# Patient Record
Sex: Female | Born: 1940
Health system: Southern US, Community
[De-identification: ages and names within clinical notes are randomized; demographics above are authoritative.]

## PROBLEM LIST (undated history)

## (undated) DIAGNOSIS — Z923 Personal history of irradiation: Secondary | ICD-10-CM

## (undated) DIAGNOSIS — Z9221 Personal history of antineoplastic chemotherapy: Secondary | ICD-10-CM

## (undated) DIAGNOSIS — I10 Essential (primary) hypertension: Secondary | ICD-10-CM

## (undated) DIAGNOSIS — C50919 Malignant neoplasm of unspecified site of unspecified female breast: Secondary | ICD-10-CM

## (undated) DIAGNOSIS — E785 Hyperlipidemia, unspecified: Secondary | ICD-10-CM

## (undated) HISTORY — DX: Hyperlipidemia, unspecified: E78.5

## (undated) HISTORY — PX: APPENDECTOMY: SHX54

## (undated) HISTORY — DX: Malignant neoplasm of unspecified site of unspecified female breast: C50.919

## (undated) HISTORY — DX: Essential (primary) hypertension: I10

---

## 1997-06-08 DIAGNOSIS — C50919 Malignant neoplasm of unspecified site of unspecified female breast: Secondary | ICD-10-CM

## 1997-06-08 DIAGNOSIS — Z923 Personal history of irradiation: Secondary | ICD-10-CM

## 1997-06-08 DIAGNOSIS — Z9221 Personal history of antineoplastic chemotherapy: Secondary | ICD-10-CM

## 1997-06-08 HISTORY — PX: MASTECTOMY: SHX3

## 1997-06-08 HISTORY — DX: Personal history of antineoplastic chemotherapy: Z92.21

## 1997-06-08 HISTORY — DX: Personal history of irradiation: Z92.3

## 1997-06-08 HISTORY — DX: Malignant neoplasm of unspecified site of unspecified female breast: C50.919

## 2004-03-08 ENCOUNTER — Ambulatory Visit: Payer: Self-pay | Admitting: Oncology

## 2004-05-15 ENCOUNTER — Ambulatory Visit: Payer: Self-pay | Admitting: Oncology

## 2004-06-08 ENCOUNTER — Ambulatory Visit: Payer: Self-pay | Admitting: Oncology

## 2004-09-03 ENCOUNTER — Ambulatory Visit: Payer: Self-pay | Admitting: Oncology

## 2004-09-06 ENCOUNTER — Ambulatory Visit: Payer: Self-pay | Admitting: Oncology

## 2004-10-06 ENCOUNTER — Ambulatory Visit: Payer: Self-pay | Admitting: Oncology

## 2004-12-10 ENCOUNTER — Ambulatory Visit: Payer: Self-pay | Admitting: Oncology

## 2005-01-06 ENCOUNTER — Ambulatory Visit: Payer: Self-pay | Admitting: Oncology

## 2005-04-08 ENCOUNTER — Ambulatory Visit: Payer: Self-pay | Admitting: Oncology

## 2005-05-08 ENCOUNTER — Ambulatory Visit: Payer: Self-pay | Admitting: Oncology

## 2005-08-12 ENCOUNTER — Ambulatory Visit: Payer: Self-pay | Admitting: Oncology

## 2005-09-06 ENCOUNTER — Ambulatory Visit: Payer: Self-pay | Admitting: Oncology

## 2005-12-10 ENCOUNTER — Ambulatory Visit: Payer: Self-pay | Admitting: Oncology

## 2006-01-06 ENCOUNTER — Ambulatory Visit: Payer: Self-pay | Admitting: Oncology

## 2006-02-06 ENCOUNTER — Ambulatory Visit: Payer: Self-pay | Admitting: Oncology

## 2006-04-13 ENCOUNTER — Ambulatory Visit: Payer: Self-pay | Admitting: Oncology

## 2006-05-08 ENCOUNTER — Ambulatory Visit: Payer: Self-pay | Admitting: Oncology

## 2006-08-24 ENCOUNTER — Ambulatory Visit: Payer: Self-pay | Admitting: Oncology

## 2006-08-25 ENCOUNTER — Ambulatory Visit: Payer: Self-pay | Admitting: Oncology

## 2006-09-07 ENCOUNTER — Ambulatory Visit: Payer: Self-pay | Admitting: Oncology

## 2007-02-07 ENCOUNTER — Ambulatory Visit: Payer: Self-pay | Admitting: Oncology

## 2007-03-02 ENCOUNTER — Ambulatory Visit: Payer: Self-pay | Admitting: Oncology

## 2007-03-09 ENCOUNTER — Ambulatory Visit: Payer: Self-pay | Admitting: Oncology

## 2007-05-11 ENCOUNTER — Ambulatory Visit: Payer: Self-pay | Admitting: Oncology

## 2007-08-07 ENCOUNTER — Ambulatory Visit: Payer: Self-pay | Admitting: Oncology

## 2007-08-31 ENCOUNTER — Ambulatory Visit: Payer: Self-pay | Admitting: Oncology

## 2007-09-07 ENCOUNTER — Ambulatory Visit: Payer: Self-pay | Admitting: Oncology

## 2008-02-07 ENCOUNTER — Ambulatory Visit: Payer: Self-pay | Admitting: Oncology

## 2008-02-22 ENCOUNTER — Ambulatory Visit: Payer: Self-pay | Admitting: Oncology

## 2008-03-08 ENCOUNTER — Ambulatory Visit: Payer: Self-pay | Admitting: Oncology

## 2008-05-16 ENCOUNTER — Ambulatory Visit: Payer: Self-pay | Admitting: Oncology

## 2008-08-06 ENCOUNTER — Ambulatory Visit: Payer: Self-pay | Admitting: Oncology

## 2008-08-22 ENCOUNTER — Ambulatory Visit: Payer: Self-pay | Admitting: Oncology

## 2008-09-06 ENCOUNTER — Ambulatory Visit: Payer: Self-pay | Admitting: Oncology

## 2009-02-06 ENCOUNTER — Ambulatory Visit: Payer: Self-pay | Admitting: Oncology

## 2009-02-19 ENCOUNTER — Ambulatory Visit: Payer: Self-pay | Admitting: Oncology

## 2009-03-08 ENCOUNTER — Ambulatory Visit: Payer: Self-pay | Admitting: Oncology

## 2009-05-21 ENCOUNTER — Ambulatory Visit: Payer: Self-pay | Admitting: Oncology

## 2009-08-06 ENCOUNTER — Ambulatory Visit: Payer: Self-pay | Admitting: Oncology

## 2009-08-20 ENCOUNTER — Ambulatory Visit: Payer: Self-pay | Admitting: Oncology

## 2009-09-06 ENCOUNTER — Ambulatory Visit: Payer: Self-pay | Admitting: Oncology

## 2010-02-06 ENCOUNTER — Ambulatory Visit: Payer: Self-pay | Admitting: Oncology

## 2010-02-20 ENCOUNTER — Ambulatory Visit: Payer: Self-pay | Admitting: Oncology

## 2010-02-21 LAB — CANCER ANTIGEN 27.29: CA 27.29: 16.7 U/mL (ref 0.0–38.6)

## 2010-03-08 ENCOUNTER — Ambulatory Visit: Payer: Self-pay | Admitting: Oncology

## 2010-05-22 ENCOUNTER — Ambulatory Visit: Payer: Self-pay | Admitting: Oncology

## 2010-08-21 ENCOUNTER — Ambulatory Visit: Payer: Self-pay | Admitting: Oncology

## 2010-08-22 LAB — CANCER ANTIGEN 27.29: CA 27.29: 12 U/mL (ref 0.0–38.6)

## 2010-09-07 ENCOUNTER — Ambulatory Visit: Payer: Self-pay | Admitting: Oncology

## 2011-02-23 ENCOUNTER — Ambulatory Visit: Payer: Self-pay | Admitting: Oncology

## 2011-02-24 LAB — CANCER ANTIGEN 27.29: CA 27.29: 11.8 U/mL (ref 0.0–38.6)

## 2011-03-09 ENCOUNTER — Ambulatory Visit: Payer: Self-pay | Admitting: Oncology

## 2011-05-25 ENCOUNTER — Ambulatory Visit: Payer: Self-pay | Admitting: Oncology

## 2011-08-25 ENCOUNTER — Ambulatory Visit: Payer: Self-pay | Admitting: Oncology

## 2011-08-26 LAB — CANCER ANTIGEN 27.29: CA 27.29: 9.2 U/mL (ref 0.0–38.6)

## 2011-09-07 ENCOUNTER — Ambulatory Visit: Payer: Self-pay | Admitting: Oncology

## 2012-02-25 ENCOUNTER — Ambulatory Visit: Payer: Self-pay | Admitting: Oncology

## 2012-02-26 LAB — CANCER ANTIGEN 27.29: CA 27.29: 17 U/mL (ref 0.0–38.6)

## 2012-03-08 ENCOUNTER — Ambulatory Visit: Payer: Self-pay | Admitting: Oncology

## 2012-04-08 ENCOUNTER — Ambulatory Visit: Payer: Self-pay | Admitting: Oncology

## 2012-05-26 ENCOUNTER — Ambulatory Visit: Payer: Self-pay | Admitting: Oncology

## 2013-03-02 ENCOUNTER — Ambulatory Visit: Payer: Self-pay | Admitting: Oncology

## 2013-03-08 ENCOUNTER — Ambulatory Visit: Payer: Self-pay | Admitting: Oncology

## 2013-05-29 ENCOUNTER — Ambulatory Visit: Payer: Self-pay | Admitting: Oncology

## 2014-03-09 ENCOUNTER — Ambulatory Visit: Payer: Self-pay | Admitting: Oncology

## 2014-03-12 LAB — CANCER ANTIGEN 27.29: CA 27.29: 12.1 U/mL (ref 0.0–38.6)

## 2014-04-08 ENCOUNTER — Ambulatory Visit: Payer: Self-pay | Admitting: Oncology

## 2014-06-08 ENCOUNTER — Ambulatory Visit: Payer: Self-pay | Admitting: Oncology

## 2014-06-14 ENCOUNTER — Ambulatory Visit: Payer: Self-pay | Admitting: Oncology

## 2014-07-09 ENCOUNTER — Ambulatory Visit: Payer: Self-pay | Admitting: Oncology

## 2015-03-12 ENCOUNTER — Other Ambulatory Visit: Payer: Self-pay | Admitting: *Deleted

## 2015-03-12 DIAGNOSIS — C50919 Malignant neoplasm of unspecified site of unspecified female breast: Secondary | ICD-10-CM

## 2015-03-13 ENCOUNTER — Inpatient Hospital Stay (HOSPITAL_BASED_OUTPATIENT_CLINIC_OR_DEPARTMENT_OTHER): Payer: Medicare Other | Admitting: Oncology

## 2015-03-13 ENCOUNTER — Inpatient Hospital Stay: Payer: Medicare Other | Attending: Oncology

## 2015-03-13 ENCOUNTER — Encounter: Payer: Self-pay | Admitting: Oncology

## 2015-03-13 VITALS — BP 137/81 | HR 82 | Temp 97.3°F | Resp 16 | Wt 131.4 lb

## 2015-03-13 DIAGNOSIS — I10 Essential (primary) hypertension: Secondary | ICD-10-CM | POA: Diagnosis not present

## 2015-03-13 DIAGNOSIS — Z170 Estrogen receptor positive status [ER+]: Secondary | ICD-10-CM

## 2015-03-13 DIAGNOSIS — C50912 Malignant neoplasm of unspecified site of left female breast: Secondary | ICD-10-CM | POA: Insufficient documentation

## 2015-03-13 DIAGNOSIS — Z79811 Long term (current) use of aromatase inhibitors: Secondary | ICD-10-CM

## 2015-03-13 DIAGNOSIS — Z9012 Acquired absence of left breast and nipple: Secondary | ICD-10-CM | POA: Diagnosis not present

## 2015-03-13 DIAGNOSIS — Z79899 Other long term (current) drug therapy: Secondary | ICD-10-CM | POA: Insufficient documentation

## 2015-03-13 DIAGNOSIS — M810 Age-related osteoporosis without current pathological fracture: Secondary | ICD-10-CM | POA: Insufficient documentation

## 2015-03-13 DIAGNOSIS — E785 Hyperlipidemia, unspecified: Secondary | ICD-10-CM | POA: Insufficient documentation

## 2015-03-13 DIAGNOSIS — Z87891 Personal history of nicotine dependence: Secondary | ICD-10-CM | POA: Diagnosis not present

## 2015-03-13 DIAGNOSIS — C50919 Malignant neoplasm of unspecified site of unspecified female breast: Secondary | ICD-10-CM

## 2015-03-13 MED ORDER — EXEMESTANE 25 MG PO TABS: 25.0000 mg | ORAL_TABLET | Freq: Every day | ORAL | Status: DC

## 2015-03-14 LAB — CANCER ANTIGEN 27.29: CA 27.29: 9.6 U/mL (ref 0.0–38.6)

## 2015-03-29 DIAGNOSIS — C50912 Malignant neoplasm of unspecified site of left female breast: Secondary | ICD-10-CM | POA: Insufficient documentation

## 2015-03-29 NOTE — Progress Notes (Signed)
[place]  Telephone:(336) [phone] Fax:(336) [phone]  ID: Amber Camacho OB: 04-16-1941  MR#: [id number]  FYB#:[id number]  Patient Care Team: No Pcp Per Patient as PCP - General (General Practice)  CHIEF COMPLAINT:  Stage IV adenocarcinoma the breast, status post neoadjuvant chemotherapy plus mastectomy and XRT in March 1999. Initially treated with tamoxifen and upon evidence of progressive disease in 2004 patient was switched to Arimidex and then to Aromasin.   Chief Complaint  Patient presents with  . Breast Cancer    1 year f/u    INTERVAL HISTORY: Patient returns to clinic for laboratory work and routine yearly evaluation. She continues to feel well and remains asymptomatic.  She is tolerating her Aromasin without any problems or complaints.  She denies any myalgias, arthralgias, or hot flashes.  She has a good appetite and has maintained her weight.  She denies any pain. She denies any chest pain.  She has no nausea, vomiting, constipation, or diarrhea.  Patient offers no specific complaints today.  REVIEW OF SYSTEMS:   Review of Systems  Constitutional: Negative for fever and malaise/fatigue.  Respiratory: Negative.   Cardiovascular: Negative.   Gastrointestinal: Negative.   Musculoskeletal: Negative.  Negative for joint pain.  Neurological: Negative.  Negative for sensory change and weakness.    As per HPI. Otherwise, a complete review of systems is negatve.  PAST MEDICAL HISTORY: Past Medical History  Diagnosis Date  . Hyperlipidemia   . Hypertension   . Breast cancer ([place])     PAST SURGICAL HISTORY: Past Surgical History  Procedure Laterality Date  . Appendectomy    . Mastectomy Left     FAMILY HISTORY: Reviewed and unchanged. No reported history of malignancy or chronic disease.     ADVANCED DIRECTIVES:    HEALTH MAINTENANCE: Social History  Substance Use Topics  . Smoking status: Former Smoker    Quit date: 03/13/1999  .  Smokeless tobacco: Never Used  . Alcohol Use: No     Colonoscopy:  PAP:  Bone density:  Lipid panel:  Allergies  Allergen Reactions  . Strawberry Extract Rash    Current Outpatient Prescriptions  Medication Sig Dispense Refill  . Calcium Carb-Cholecalciferol (CALCIUM 600 + D PO) Take 1 tablet by mouth daily.    . Multiple Vitamin (MULTIVITAMIN) tablet Take 1 tablet by mouth daily.    Marland Kitchen BONIVA 150 MG tablet     . exemestane (AROMASIN) 25 MG tablet Take 1 tablet (25 mg total) by mouth daily after breakfast. 30 tablet 11  . lisinopril-hydrochlorothiazide (PRINZIDE,ZESTORETIC) 10-12.5 MG tablet     . metoprolol succinate (TOPROL-XL) 25 MG 24 hr tablet     . NEXIUM 40 MG capsule     . simvastatin (ZOCOR) 20 MG tablet     . Vitamin D, Ergocalciferol, (DRISDOL) 50000 UNITS CAPS capsule      No current facility-administered medications for this visit.    OBJECTIVE: Filed Vitals:   03/13/15 1143  BP: 137/81  Pulse: 82  Temp: 97.3 F (36.3 C)  Resp: 16     There is no height on file to calculate BMI.    ECOG FS:0 - Asymptomatic  General: Well-developed, well-nourished, no acute distress. Eyes: Pink conjunctiva, anicteric sclera. Breasts: Right breast and axilla without lumps or masses. Left chest wall without evidence of recurrence. Lungs: Clear to auscultation bilaterally. Heart: Regular rate and rhythm. No rubs, murmurs, or gallops. Abdomen: Soft, nontender, nondistended. No organomegaly noted, normoactive bowel sounds. Musculoskeletal: No edema, cyanosis, or  clubbing. Neuro: Alert, answering all questions appropriately. Cranial nerves grossly intact. Skin: No rashes or petechiae noted. Psych: Normal affect.   LAB RESULTS:  No results found for: NA, K, CL, CO2, GLUCOSE, BUN, CREATININE, CALCIUM, PROT, ALBUMIN, AST, ALT, ALKPHOS, BILITOT, GFRNONAA, GFRAA  No results found for: WBC, NEUTROABS, HGB, HCT, MCV, PLT   STUDIES: No results found.  ASSESSMENT: Stage IV  carcinoma of breast  PLAN:    1. Breast cancer: No evidence of disease.  She continues to tolerate her Aromasin well.  She will likely remain on this indefinitely. Right diagnostic mammogram on June 14, 2014 was reported as BI-RADS 2, repeat in January 2017. CA 27-29 remains within normal limits.  Return to clinic in one year for routine evaluation.   2. Osteoporosis: Bone mineral density on June 14, 2014 was reported at -2.8 which is unchanged since October of 2013. Continue monthly Boniva.  Patient expressed understanding and was in agreement with this plan. She also understands that She can call clinic at any time with any questions, concerns, or complaints.     Lloyd Huger, MD   03/29/2015 2:40 PM

## 2015-06-18 ENCOUNTER — Ambulatory Visit: Payer: Medicare Other

## 2015-06-20 ENCOUNTER — Other Ambulatory Visit: Payer: Self-pay | Admitting: Oncology

## 2015-06-20 ENCOUNTER — Ambulatory Visit
Admission: RE | Admit: 2015-06-20 | Discharge: 2015-06-20 | Disposition: A | Discharge status: Home / Self Care | Payer: Medicare Other | Source: Ambulatory Visit | Attending: Oncology | Admitting: Oncology

## 2015-06-20 DIAGNOSIS — Z1231 Encounter for screening mammogram for malignant neoplasm of breast: Secondary | ICD-10-CM

## 2015-06-20 DIAGNOSIS — C50912 Malignant neoplasm of unspecified site of left female breast: Secondary | ICD-10-CM

## 2015-08-15 DIAGNOSIS — L249 Irritant contact dermatitis, unspecified cause: Secondary | ICD-10-CM | POA: Diagnosis not present

## 2015-08-29 DIAGNOSIS — L249 Irritant contact dermatitis, unspecified cause: Secondary | ICD-10-CM | POA: Diagnosis not present

## 2016-03-11 NOTE — Progress Notes (Signed)
Amber Camacho  Telephone:(336) [phone] Fax:(336) [phone]  ID: Amber Camacho OB: 1941/04/06  MR#: [id number]  [id number]  Patient Care Team: No Pcp Per Patient as PCP - General (General Practice)  CHIEF COMPLAINT:  Stage IV ER positive adenocarcinoma of left breast, unspecified site.  INTERVAL HISTORY: Patient returns to clinic for laboratory work and routine yearly evaluation. She continues to feel well and remains asymptomatic.  She is tolerating her Aromasin without any problems or complaints.  She denies any myalgias, arthralgias, or hot flashes.  She has a good appetite and has maintained her weight.  She denies any pain. She denies any chest pain.  She has no nausea, vomiting, constipation, or diarrhea.  Patient offers no specific complaints today.  REVIEW OF SYSTEMS:   Review of Systems  Constitutional: Negative for fever, malaise/fatigue and weight loss.  Respiratory: Negative.  Negative for cough and shortness of breath.   Cardiovascular: Negative.  Negative for chest pain and leg swelling.  Gastrointestinal: Negative.  Negative for abdominal pain.  Genitourinary: Negative.   Musculoskeletal: Negative.  Negative for joint pain.  Neurological: Negative.  Negative for sensory change and weakness.  Psychiatric/Behavioral: Negative.  The patient is not nervous/anxious.     As per HPI. Otherwise, a complete review of systems is negative.  PAST MEDICAL HISTORY: Past Medical History:  Diagnosis Date  . Breast cancer ([place])   . Hyperlipidemia   . Hypertension     PAST SURGICAL HISTORY: Past Surgical History:  Procedure Laterality Date  . APPENDECTOMY    . MASTECTOMY Left 1999    FAMILY HISTORY: Reviewed and unchanged. No reported history of malignancy or chronic disease.     ADVANCED DIRECTIVES:    HEALTH MAINTENANCE: Social History  Substance Use Topics  . Smoking status: Former Smoker    Quit date: 03/13/1999  . Smokeless tobacco: Never  Used  . Alcohol use No     Colonoscopy:  PAP:  Bone density:  Lipid panel:  Allergies  Allergen Reactions  . Strawberry Extract Rash    Current Outpatient Prescriptions  Medication Sig Dispense Refill  . atorvastatin (LIPITOR) 40 MG tablet Take 40 mg by mouth daily.    Marland Kitchen BONIVA 150 MG tablet Take 150 mg by mouth every 30 (thirty) days.     Marland Kitchen exemestane (AROMASIN) 25 MG tablet Take 1 tablet (25 mg total) by mouth daily after breakfast. 30 tablet 11  . lisinopril-hydrochlorothiazide (PRINZIDE,ZESTORETIC) 10-12.5 MG tablet Take 1 tablet by mouth daily.     . metoprolol succinate (TOPROL-XL) 25 MG 24 hr tablet Take 25 mg by mouth daily.     Marland Kitchen omeprazole (PRILOSEC) 40 MG capsule Take 40 mg by mouth daily.    . Vitamin D, Ergocalciferol, (DRISDOL) 50000 UNITS CAPS capsule Take 50,000 Units by mouth every 7 (seven) days.     Marland Kitchen letrozole (FEMARA) 2.5 MG tablet Take 1 tablet (2.5 mg total) by mouth daily. 30 tablet 12   No current facility-administered medications for this visit.     OBJECTIVE: Vitals:   03/12/16 1101  BP: (!) 180/99  Pulse: (!) 103  Resp: 18  Temp: (!) 96.7 F (35.9 C)     There is no height or weight on file to calculate BMI.    ECOG FS:0 - Asymptomatic  General: Well-developed, well-nourished, no acute distress. Eyes: Pink conjunctiva, anicteric sclera. Breasts: Right breast and axilla without lumps or masses. Left chest wall without evidence of recurrence.Patient refused exam today. Lungs: Clear to auscultation bilaterally.  Heart: Regular rate and rhythm. No rubs, murmurs, or gallops. Abdomen: Soft, nontender, nondistended. No organomegaly noted, normoactive bowel sounds. Musculoskeletal: No edema, cyanosis, or clubbing. Neuro: Alert, answering all questions appropriately. Cranial nerves grossly intact. Skin: No rashes or petechiae noted. Psych: Normal affect.   LAB RESULTS:  No results found for: NA, K, CL, CO2, GLUCOSE, BUN, CREATININE, CALCIUM, PROT,  ALBUMIN, AST, ALT, ALKPHOS, BILITOT, GFRNONAA, GFRAA  No results found for: WBC, NEUTROABS, HGB, HCT, MCV, PLT  Lab Results  Component Value Date   LABCA2 14.4 03/12/2016     STUDIES: No results found.  ASSESSMENT: Stage IV ER positive adenocarcinoma of left breast, unspecified site  PLAN:    1. Stage IV ER positive adenocarcinoma of left breast, unspecified site: Status post neoadjuvant chemotherapy plus mastectomy and XRT in March 1999. Initially treated with tamoxifen and upon evidence of progressive disease in 2004 patient was switched to Arimidex and then to Aromasin.  No evidence of disease.  We briefly discussed the possibility of discontinuing treatment, but patient has elected to use continue with lifelong therapy. She also states her pharmacy no longer carries Aromasin and wishes to be switched to another aromatase inhibitor, therefore a prescription for letrozole was called into her pharmacy. Repeat mammogram and bone mineral density in January 2018. Return to clinic in 1 year for routine evaluation.   2. Osteoporosis: Bone mineral density on June 14, 2014 was reported at -2.8 which is unchanged since October of 2013. Continue monthly Boniva. Repeat bone mineral density as above.  Patient expressed understanding and was in agreement with this plan. She also understands that She can call clinic at any time with any questions, concerns, or complaints.     Lloyd Huger, MD   03/13/2016 6:03 PM

## 2016-03-12 ENCOUNTER — Inpatient Hospital Stay: Payer: Medicare Other | Attending: Oncology

## 2016-03-12 ENCOUNTER — Inpatient Hospital Stay (HOSPITAL_BASED_OUTPATIENT_CLINIC_OR_DEPARTMENT_OTHER): Payer: Medicare Other | Admitting: Oncology

## 2016-03-12 VITALS — BP 180/99 | HR 103 | Temp 96.7°F | Resp 18 | Wt 130.0 lb

## 2016-03-12 DIAGNOSIS — Z170 Estrogen receptor positive status [ER+]: Secondary | ICD-10-CM | POA: Insufficient documentation

## 2016-03-12 DIAGNOSIS — M810 Age-related osteoporosis without current pathological fracture: Secondary | ICD-10-CM

## 2016-03-12 DIAGNOSIS — Z87891 Personal history of nicotine dependence: Secondary | ICD-10-CM | POA: Insufficient documentation

## 2016-03-12 DIAGNOSIS — Z79811 Long term (current) use of aromatase inhibitors: Secondary | ICD-10-CM

## 2016-03-12 DIAGNOSIS — I10 Essential (primary) hypertension: Secondary | ICD-10-CM | POA: Diagnosis not present

## 2016-03-12 DIAGNOSIS — E785 Hyperlipidemia, unspecified: Secondary | ICD-10-CM | POA: Insufficient documentation

## 2016-03-12 DIAGNOSIS — C50912 Malignant neoplasm of unspecified site of left female breast: Secondary | ICD-10-CM | POA: Insufficient documentation

## 2016-03-12 DIAGNOSIS — Z923 Personal history of irradiation: Secondary | ICD-10-CM

## 2016-03-12 DIAGNOSIS — Z9012 Acquired absence of left breast and nipple: Secondary | ICD-10-CM

## 2016-03-12 DIAGNOSIS — Z79899 Other long term (current) drug therapy: Secondary | ICD-10-CM | POA: Diagnosis not present

## 2016-03-12 DIAGNOSIS — Z9221 Personal history of antineoplastic chemotherapy: Secondary | ICD-10-CM

## 2016-03-12 MED ORDER — LETROZOLE 2.5 MG PO TABS: 2.5000 mg | ORAL_TABLET | Freq: Every day | ORAL | 12 refills | Status: DC

## 2016-03-12 NOTE — Progress Notes (Signed)
States is feeling well. Requests to change aromasin to different medication since pharmacy has limited supply each time she gets a refill.

## 2016-03-13 LAB — CANCER ANTIGEN 27.29: CA 27.29: 14.4 U/mL (ref 0.0–38.6)

## 2016-06-22 ENCOUNTER — Ambulatory Visit
Admission: RE | Admit: 2016-06-22 | Discharge: 2016-06-22 | Disposition: A | Discharge status: Home / Self Care | Payer: Medicare Other | Source: Ambulatory Visit | Attending: Oncology | Admitting: Oncology

## 2016-06-22 DIAGNOSIS — Z780 Asymptomatic menopausal state: Secondary | ICD-10-CM | POA: Diagnosis not present

## 2016-06-22 DIAGNOSIS — C50912 Malignant neoplasm of unspecified site of left female breast: Secondary | ICD-10-CM

## 2016-06-22 DIAGNOSIS — Z1231 Encounter for screening mammogram for malignant neoplasm of breast: Secondary | ICD-10-CM | POA: Insufficient documentation

## 2016-06-22 DIAGNOSIS — Z170 Estrogen receptor positive status [ER+]: Secondary | ICD-10-CM | POA: Insufficient documentation

## 2016-06-22 DIAGNOSIS — M810 Age-related osteoporosis without current pathological fracture: Secondary | ICD-10-CM | POA: Insufficient documentation

## 2016-06-22 HISTORY — DX: Personal history of antineoplastic chemotherapy: Z92.21

## 2016-06-22 HISTORY — DX: Personal history of irradiation: Z92.3

## 2016-07-08 DIAGNOSIS — K635 Polyp of colon: Secondary | ICD-10-CM | POA: Diagnosis not present

## 2016-07-15 DIAGNOSIS — K5730 Diverticulosis of large intestine without perforation or abscess without bleeding: Secondary | ICD-10-CM | POA: Diagnosis not present

## 2016-07-15 DIAGNOSIS — Z1211 Encounter for screening for malignant neoplasm of colon: Secondary | ICD-10-CM | POA: Diagnosis not present

## 2016-07-15 DIAGNOSIS — D127 Benign neoplasm of rectosigmoid junction: Secondary | ICD-10-CM | POA: Diagnosis not present

## 2016-07-15 DIAGNOSIS — R197 Diarrhea, unspecified: Secondary | ICD-10-CM | POA: Diagnosis not present

## 2016-07-15 DIAGNOSIS — K648 Other hemorrhoids: Secondary | ICD-10-CM | POA: Diagnosis not present

## 2016-07-15 DIAGNOSIS — D125 Benign neoplasm of sigmoid colon: Secondary | ICD-10-CM | POA: Diagnosis not present

## 2016-07-15 DIAGNOSIS — K635 Polyp of colon: Secondary | ICD-10-CM | POA: Diagnosis not present

## 2016-08-05 DIAGNOSIS — K5730 Diverticulosis of large intestine without perforation or abscess without bleeding: Secondary | ICD-10-CM | POA: Diagnosis not present

## 2016-08-05 DIAGNOSIS — K635 Polyp of colon: Secondary | ICD-10-CM | POA: Diagnosis not present

## 2016-08-05 DIAGNOSIS — K641 Second degree hemorrhoids: Secondary | ICD-10-CM | POA: Diagnosis not present

## 2017-03-12 ENCOUNTER — Other Ambulatory Visit: Payer: Self-pay | Admitting: *Deleted

## 2017-03-12 DIAGNOSIS — C50919 Malignant neoplasm of unspecified site of unspecified female breast: Secondary | ICD-10-CM

## 2017-03-14 NOTE — Progress Notes (Signed)
[place]  Telephone:(336) [phone] Fax:(336) [phone]  ID: Amber Camacho OB: Sep 08, 1940  MR#: [id number]  CSN#:[id number]  Patient Care Team: Patient, No Pcp Per as PCP - General (General Practice)  CHIEF COMPLAINT:  Stage IV ER positive adenocarcinoma of left breast, unspecified site.  INTERVAL HISTORY: Patient returns to clinic for laboratory work and routine yearly evaluation. She continues to feel well and remains asymptomatic.  She is tolerating letrozole without significant side effects. She denies any myalgias, arthralgias, or hot flashes. She has occasional back pain that is unrelated.  She has a good appetite and has maintained her weight. She denies any chest pain or shortness of breath. She denies any nausea, vomiting, constipation, or diarrhea. She has no urinary complaints. Patient offers no specific complaints today.  REVIEW OF SYSTEMS:   Review of Systems  Constitutional: Negative for fever, malaise/fatigue and weight loss.  Respiratory: Negative.  Negative for cough and shortness of breath.   Cardiovascular: Negative.  Negative for chest pain and leg swelling.  Gastrointestinal: Negative.  Negative for abdominal pain.  Genitourinary: Negative.   Musculoskeletal: Positive for back pain. Negative for joint pain.  Skin: Negative.  Negative for rash.  Neurological: Negative.  Negative for sensory change and weakness.  Psychiatric/Behavioral: Negative.  The patient is not nervous/anxious.     As per HPI. Otherwise, a complete review of systems is negative.  PAST MEDICAL HISTORY: Past Medical History:  Diagnosis Date  . Breast cancer ([place]) 1999   LT MASTECTOMY  . Hyperlipidemia   . Hypertension   . Personal history of chemotherapy 1999   BREAST CA  . Personal history of radiation therapy 1999   BREAST CA    PAST SURGICAL HISTORY: Past Surgical History:  Procedure Laterality Date  . APPENDECTOMY    . MASTECTOMY Left 1999   BREAST CA     FAMILY HISTORY: Reviewed and unchanged. No reported history of malignancy or chronic disease.     ADVANCED DIRECTIVES:    HEALTH MAINTENANCE: Social History  Substance Use Topics  . Smoking status: Former Smoker    Quit date: 03/13/1999  . Smokeless tobacco: Never Used  . Alcohol use No     Colonoscopy:  PAP:  Bone density:  Lipid panel:  Allergies  Allergen Reactions  . Strawberry Extract Rash    Current Outpatient Prescriptions  Medication Sig Dispense Refill  . atorvastatin (LIPITOR) 40 MG tablet Take 40 mg by mouth daily.    Marland Kitchen BONIVA 150 MG tablet Take 150 mg by mouth every 30 (thirty) days.     Marland Kitchen letrozole (FEMARA) 2.5 MG tablet Take 1 tablet (2.5 mg total) by mouth daily. 30 tablet 12  . lisinopril-hydrochlorothiazide (PRINZIDE,ZESTORETIC) 10-12.5 MG tablet Take 1 tablet by mouth daily.     . metoprolol succinate (TOPROL-XL) 25 MG 24 hr tablet Take 25 mg by mouth daily.     Marland Kitchen omeprazole (PRILOSEC) 40 MG capsule Take 40 mg by mouth daily.    . Vitamin D, Ergocalciferol, (DRISDOL) 50000 UNITS CAPS capsule Take 50,000 Units by mouth every 7 (seven) days.      Current Facility-Administered Medications  Medication Dose Route Frequency Provider Last Rate Last Dose  . Influenza vac split quadrivalent PF (FLUARIX) injection 0.5 mL  0.5 mL Intramuscular Once Lloyd Huger, MD        OBJECTIVE: Vitals:   03/15/17 1049  BP: 133/67  Pulse: 61  Resp: 18  Temp: (!) 96.1 F (35.6 C)     There  is no height or weight on file to calculate BMI.    ECOG FS:0 - Asymptomatic  General: Well-developed, well-nourished, no acute distress. Eyes: Pink conjunctiva, anicteric sclera. Breasts: Right breast and axilla without lumps or masses. Left chest wall without evidence of recurrence. Lungs: Clear to auscultation bilaterally. Heart: Regular rate and rhythm. No rubs, murmurs, or gallops. Abdomen: Soft, nontender, nondistended. No organomegaly noted, normoactive bowel  sounds. Musculoskeletal: No edema, cyanosis, or clubbing. Neuro: Alert, answering all questions appropriately. Cranial nerves grossly intact. Skin: No rashes or petechiae noted. Psych: Normal affect.   LAB RESULTS:  No results found for: NA, K, CL, CO2, GLUCOSE, BUN, CREATININE, CALCIUM, PROT, ALBUMIN, AST, ALT, ALKPHOS, BILITOT, GFRNONAA, GFRAA  No results found for: WBC, NEUTROABS, HGB, HCT, MCV, PLT  Lab Results  Component Value Date   LABCA2 14.4 03/12/2016     STUDIES: No results found.  ASSESSMENT: Stage IV ER positive adenocarcinoma of left breast, unspecified site  PLAN:    1. Stage IV ER positive adenocarcinoma of left breast, unspecified site: Status post neoadjuvant chemotherapy plus mastectomy and XRT in March 1999. Initially treated with tamoxifen and upon evidence of progressive disease in 2004 patient was switched to Arimidex and then to Aromasin. Patient states her pharmacy discontinued carrying Aromasin and therefore was switched to letrozole in October 2017.  No evidence of disease.  We briefly discussed the possibility of discontinuing treatment, but patient has elected to use continue with lifelong therapy. Right breast screening mammogram on June 22, 2016 was reported as BI-RADS 1. Repeat in January 2019. Return to clinic in 1 year for routine evaluation.   2. Osteoporosis: Bone mineral density on June 22, 2016 was reported -2.9 which is essentially unchanged since October of 2013. Continue monthly Boniva as well as calcium and vitamin D supplementation. Repeat bone mineral density in January 2019.  Patient expressed understanding and was in agreement with this plan. She also understands that She can call clinic at any time with any questions, concerns, or complaints.     Lloyd Huger, MD   03/15/2017 10:59 AM

## 2017-03-15 ENCOUNTER — Inpatient Hospital Stay: Payer: Medicare Other | Attending: Oncology | Admitting: Oncology

## 2017-03-15 ENCOUNTER — Inpatient Hospital Stay: Payer: Medicare Other

## 2017-03-15 VITALS — BP 133/67 | HR 61 | Temp 96.1°F | Resp 18 | Wt 117.7 lb

## 2017-03-15 DIAGNOSIS — Z923 Personal history of irradiation: Secondary | ICD-10-CM | POA: Diagnosis not present

## 2017-03-15 DIAGNOSIS — Z79811 Long term (current) use of aromatase inhibitors: Secondary | ICD-10-CM | POA: Diagnosis not present

## 2017-03-15 DIAGNOSIS — Z9221 Personal history of antineoplastic chemotherapy: Secondary | ICD-10-CM | POA: Insufficient documentation

## 2017-03-15 DIAGNOSIS — Z9012 Acquired absence of left breast and nipple: Secondary | ICD-10-CM | POA: Diagnosis not present

## 2017-03-15 DIAGNOSIS — M549 Dorsalgia, unspecified: Secondary | ICD-10-CM | POA: Diagnosis not present

## 2017-03-15 DIAGNOSIS — C50912 Malignant neoplasm of unspecified site of left female breast: Secondary | ICD-10-CM | POA: Diagnosis not present

## 2017-03-15 DIAGNOSIS — Z23 Encounter for immunization: Secondary | ICD-10-CM | POA: Insufficient documentation

## 2017-03-15 DIAGNOSIS — C50919 Malignant neoplasm of unspecified site of unspecified female breast: Secondary | ICD-10-CM

## 2017-03-15 DIAGNOSIS — M810 Age-related osteoporosis without current pathological fracture: Secondary | ICD-10-CM | POA: Insufficient documentation

## 2017-03-15 DIAGNOSIS — Z79899 Other long term (current) drug therapy: Secondary | ICD-10-CM

## 2017-03-15 DIAGNOSIS — E785 Hyperlipidemia, unspecified: Secondary | ICD-10-CM | POA: Diagnosis not present

## 2017-03-15 DIAGNOSIS — Z87891 Personal history of nicotine dependence: Secondary | ICD-10-CM | POA: Insufficient documentation

## 2017-03-15 DIAGNOSIS — Z170 Estrogen receptor positive status [ER+]: Secondary | ICD-10-CM | POA: Insufficient documentation

## 2017-03-15 DIAGNOSIS — I10 Essential (primary) hypertension: Secondary | ICD-10-CM | POA: Insufficient documentation

## 2017-03-15 MED ORDER — LETROZOLE 2.5 MG PO TABS: 2.5000 mg | ORAL_TABLET | Freq: Every day | ORAL | 12 refills | Status: DC

## 2017-03-15 MED ORDER — INFLUENZA VAC SPLIT QUAD 0.5 ML IM SUSY: 0.5000 mL | PREFILLED_SYRINGE | Freq: Once | INTRAMUSCULAR | Status: DC

## 2017-03-15 MED ORDER — INFLUENZA VAC SPLIT HIGH-DOSE 0.5 ML IM SUSY
0.5000 mL | PREFILLED_SYRINGE | INTRAMUSCULAR | Status: AC
  Administered 2017-03-15: 0.5 mL via INTRAMUSCULAR

## 2017-03-15 NOTE — Progress Notes (Signed)
Patient denies any concerns today.  

## 2017-03-16 LAB — CANCER ANTIGEN 27.29: CAN 27.29: 10.4 U/mL (ref 0.0–38.6)

## 2017-06-23 ENCOUNTER — Ambulatory Visit
Admission: RE | Admit: 2017-06-23 | Discharge: 2017-06-23 | Disposition: A | Discharge status: Home / Self Care | Payer: Medicare Other | Source: Ambulatory Visit | Attending: Oncology | Admitting: Oncology

## 2017-06-23 DIAGNOSIS — Z170 Estrogen receptor positive status [ER+]: Secondary | ICD-10-CM | POA: Diagnosis not present

## 2017-06-23 DIAGNOSIS — Z1231 Encounter for screening mammogram for malignant neoplasm of breast: Secondary | ICD-10-CM | POA: Insufficient documentation

## 2017-06-23 DIAGNOSIS — M810 Age-related osteoporosis without current pathological fracture: Secondary | ICD-10-CM | POA: Diagnosis not present

## 2017-06-23 DIAGNOSIS — C50912 Malignant neoplasm of unspecified site of left female breast: Secondary | ICD-10-CM | POA: Insufficient documentation

## 2017-11-23 DIAGNOSIS — T63461A Toxic effect of venom of wasps, accidental (unintentional), initial encounter: Secondary | ICD-10-CM | POA: Diagnosis not present

## 2017-11-23 DIAGNOSIS — T63441A Toxic effect of venom of bees, accidental (unintentional), initial encounter: Secondary | ICD-10-CM | POA: Diagnosis not present

## 2017-11-27 DIAGNOSIS — H26493 Other secondary cataract, bilateral: Secondary | ICD-10-CM | POA: Diagnosis not present

## 2017-11-27 DIAGNOSIS — H35372 Puckering of macula, left eye: Secondary | ICD-10-CM | POA: Diagnosis not present

## 2018-03-13 NOTE — Progress Notes (Signed)
[place]  Telephone:(336) [phone] Fax:(336) [phone]  ID: Chaney Malling OB: 26-Sep-1940  MR#: [id number]  CSN#:[id number]  Patient Care Team: Patient, No Pcp Per as PCP - General (General Practice)  CHIEF COMPLAINT:  Stage IV ER positive adenocarcinoma of left breast, unspecified site.  INTERVAL HISTORY: Patient returns to clinic today for her routine yearly evaluation.  She continues to tolerate letrozole well without significant side effects.  She currently feels well and is asymptomatic. She denies any myalgias, arthralgias, or hot flashes.  She has no neurologic complaints.  She denies any recent fevers or illnesses.  She has a good appetite and has maintained her weight. She denies any chest pain or shortness of breath. She denies any nausea, vomiting, constipation, or diarrhea. She has no urinary complaints.  Patient feels at her baseline offers no specific complaints today.  REVIEW OF SYSTEMS:   Review of Systems  Constitutional: Negative.  Negative for fever, malaise/fatigue and weight loss.  Respiratory: Negative.  Negative for cough and shortness of breath.   Cardiovascular: Negative.  Negative for chest pain and leg swelling.  Gastrointestinal: Negative.  Negative for abdominal pain.  Genitourinary: Negative.  Negative for dysuria.  Musculoskeletal: Negative.  Negative for back pain and joint pain.  Skin: Negative.  Negative for rash.  Neurological: Negative.  Negative for sensory change, weakness and headaches.  Psychiatric/Behavioral: Negative.  The patient is not nervous/anxious.     As per HPI. Otherwise, a complete review of systems is negative.  PAST MEDICAL HISTORY: Past Medical History:  Diagnosis Date  . Breast cancer ([place]) 1999   LT MASTECTOMY  . Hyperlipidemia   . Hypertension   . Personal history of chemotherapy 1999   BREAST CA  . Personal history of radiation therapy 1999   BREAST CA    PAST SURGICAL HISTORY: Past Surgical  History:  Procedure Laterality Date  . APPENDECTOMY    . MASTECTOMY Left 1999   BREAST CA    FAMILY HISTORY: Reviewed and unchanged. No reported history of malignancy or chronic disease.     ADVANCED DIRECTIVES:    HEALTH MAINTENANCE: Social History   Tobacco Use  . Smoking status: Former Smoker    Last attempt to quit: 03/13/1999    Years since quitting: 19.0  . Smokeless tobacco: Never Used  Substance Use Topics  . Alcohol use: No    Alcohol/week: 0.0 standard drinks  . Drug use: No     Colonoscopy:  PAP:  Bone density:  Lipid panel:  Allergies  Allergen Reactions  . Strawberry Extract Rash    Current Outpatient Medications  Medication Sig Dispense Refill  . atorvastatin (LIPITOR) 40 MG tablet Take 40 mg by mouth daily.    Marland Kitchen BONIVA 150 MG tablet Take 150 mg by mouth every 30 (thirty) days.     Marland Kitchen letrozole (FEMARA) 2.5 MG tablet Take 1 tablet (2.5 mg total) by mouth daily. 30 tablet 12  . lisinopril-hydrochlorothiazide (PRINZIDE,ZESTORETIC) 10-12.5 MG tablet Take 1 tablet by mouth daily.     . metoprolol succinate (TOPROL-XL) 25 MG 24 hr tablet Take 25 mg by mouth daily.     Marland Kitchen omeprazole (PRILOSEC) 40 MG capsule Take 40 mg by mouth daily.    . Vitamin D, Ergocalciferol, (DRISDOL) 50000 UNITS CAPS capsule Take 50,000 Units by mouth every 7 (seven) days.      No current facility-administered medications for this visit.     OBJECTIVE: Vitals:   03/14/18 1057 03/14/18 1103  BP:  Marland Kitchen)  160/92  Pulse:  70  Resp: 16   Temp:  97.8 F (36.6 C)     Body mass index is 24.52 kg/m.    ECOG FS:0 - Asymptomatic  General: Well-developed, well-nourished, no acute distress. Eyes: Pink conjunctiva, anicteric sclera. HEENT: Normocephalic, moist mucous membranes. Breast: Patient declined breast exam today. Lungs: Clear to auscultation bilaterally. Heart: Regular rate and rhythm. No rubs, murmurs, or gallops. Abdomen: Soft, nontender, nondistended. No organomegaly noted,  normoactive bowel sounds. Musculoskeletal: No edema, cyanosis, or clubbing. Neuro: Alert, answering all questions appropriately. Cranial nerves grossly intact. Skin: No rashes or petechiae noted. Psych: Normal affect.  LAB RESULTS:  No results found for: NA, K, CL, CO2, GLUCOSE, BUN, CREATININE, CALCIUM, PROT, ALBUMIN, AST, ALT, ALKPHOS, BILITOT, GFRNONAA, GFRAA  No results found for: WBC, NEUTROABS, HGB, HCT, MCV, PLT  Lab Results  Component Value Date   LABCA2 14.4 03/12/2016     STUDIES: No results found.  ASSESSMENT: Stage IV ER positive adenocarcinoma of left breast, unspecified site  PLAN:    1. Stage IV ER positive adenocarcinoma of left breast, unspecified site: Status post neoadjuvant chemotherapy plus mastectomy and XRT in March 1999. Initially treated with tamoxifen and upon evidence of progressive disease in 2004 patient was switched to Arimidex and then to Aromasin. Patient states her pharmacy discontinued carrying Aromasin and therefore was switched to letrozole in October 2017.  No evidence of disease.  We again discussed possibility of discontinuing treatment, but patient wishes to continue lifelong therapy.  Her most recent unilateral right mammogram on June 23, 2017 was reported as BI-RADS 1 repeat in January 2020.  Return to clinic in 1 year for further evaluation.   2. Osteoporosis: Bone mineral density on June 23, 2017 was reported as -3.0 which is essentially unchanged since of October 2013.  Continue monthly Boniva as well as calcium and vitamin D supplementation.  Repeat bone marrow density in January 2020.  Patient expressed understanding and was in agreement with this plan. She also understands that She can call clinic at any time with any questions, concerns, or complaints.     Lloyd Huger, MD   03/14/2018 3:20 PM

## 2018-03-14 ENCOUNTER — Other Ambulatory Visit: Payer: Self-pay

## 2018-03-14 ENCOUNTER — Encounter: Payer: Self-pay | Admitting: Oncology

## 2018-03-14 ENCOUNTER — Inpatient Hospital Stay: Payer: Medicare Other | Attending: Oncology | Admitting: Oncology

## 2018-03-14 VITALS — BP 160/92 | HR 70 | Temp 97.8°F | Resp 16 | Ht 59.0 in | Wt 121.4 lb

## 2018-03-14 DIAGNOSIS — M810 Age-related osteoporosis without current pathological fracture: Secondary | ICD-10-CM

## 2018-03-14 DIAGNOSIS — Z170 Estrogen receptor positive status [ER+]: Secondary | ICD-10-CM | POA: Diagnosis not present

## 2018-03-14 DIAGNOSIS — Z923 Personal history of irradiation: Secondary | ICD-10-CM | POA: Insufficient documentation

## 2018-03-14 DIAGNOSIS — Z9221 Personal history of antineoplastic chemotherapy: Secondary | ICD-10-CM | POA: Insufficient documentation

## 2018-03-14 DIAGNOSIS — Z87891 Personal history of nicotine dependence: Secondary | ICD-10-CM

## 2018-03-14 DIAGNOSIS — C50912 Malignant neoplasm of unspecified site of left female breast: Secondary | ICD-10-CM | POA: Diagnosis not present

## 2018-03-14 DIAGNOSIS — Z9012 Acquired absence of left breast and nipple: Secondary | ICD-10-CM | POA: Diagnosis not present

## 2018-03-14 MED ORDER — LETROZOLE 2.5 MG PO TABS: 2.5000 mg | ORAL_TABLET | Freq: Every day | ORAL | 12 refills | Status: DC

## 2018-03-14 NOTE — Progress Notes (Signed)
Patient here for follow up. She reports no changes since her last visit. Last breast exam was last year, Mammo 06/2017

## 2018-06-27 ENCOUNTER — Ambulatory Visit
Admission: RE | Admit: 2018-06-27 | Discharge: 2018-06-27 | Disposition: A | Discharge status: Home / Self Care | Payer: Medicare Other | Source: Ambulatory Visit | Attending: Oncology | Admitting: Oncology

## 2018-06-27 DIAGNOSIS — M810 Age-related osteoporosis without current pathological fracture: Secondary | ICD-10-CM | POA: Insufficient documentation

## 2018-06-27 DIAGNOSIS — C50912 Malignant neoplasm of unspecified site of left female breast: Secondary | ICD-10-CM

## 2018-06-27 DIAGNOSIS — Z170 Estrogen receptor positive status [ER+]: Secondary | ICD-10-CM

## 2018-06-27 DIAGNOSIS — Z853 Personal history of malignant neoplasm of breast: Secondary | ICD-10-CM | POA: Insufficient documentation

## 2018-06-27 DIAGNOSIS — Z1231 Encounter for screening mammogram for malignant neoplasm of breast: Secondary | ICD-10-CM | POA: Diagnosis not present

## 2019-03-20 NOTE — Progress Notes (Signed)
[place]  Telephone:(336) [phone] Fax:(336) [phone]  ID: Amber Camacho OB: 02-21-1941  MR#: [id number]  CSN#:[id number]  Patient Care Team: Patient, No Pcp Per as PCP - General (General Practice)  CHIEF COMPLAINT:  Stage IV ER positive adenocarcinoma of left breast, unspecified site.  INTERVAL HISTORY: Patient returns to clinic today for routine yearly evaluation.  She continues to tolerate letrozole well without significant side effects.  She currently feels well and is asymptomatic.  She denies any myalgias, arthralgias, or hot flashes.  She has no neurologic complaints.  She denies any recent fevers or illnesses.  She has a good appetite and has maintained her weight.  She denies any chest pain, shortness of breath, cough, or hemoptysis.  She denies any nausea, vomiting, constipation, or diarrhea. She has no urinary complaints.  Patient offers no specific complaints today.  REVIEW OF SYSTEMS:   Review of Systems  Constitutional: Negative.  Negative for fever, malaise/fatigue and weight loss.  Respiratory: Negative.  Negative for cough and shortness of breath.   Cardiovascular: Negative.  Negative for chest pain and leg swelling.  Gastrointestinal: Negative.  Negative for abdominal pain.  Genitourinary: Negative.  Negative for dysuria.  Musculoskeletal: Negative.  Negative for back pain and joint pain.  Skin: Negative.  Negative for rash.  Neurological: Negative.  Negative for sensory change, weakness and headaches.  Psychiatric/Behavioral: Negative.  The patient is not nervous/anxious.     As per HPI. Otherwise, a complete review of systems is negative.  PAST MEDICAL HISTORY: Past Medical History:  Diagnosis Date  . Breast cancer ([place]) 1999   LT MASTECTOMY  . Hyperlipidemia   . Hypertension   . Personal history of chemotherapy 1999   BREAST CA  . Personal history of radiation therapy 1999   BREAST CA    PAST SURGICAL HISTORY: Past Surgical History:   Procedure Laterality Date  . APPENDECTOMY    . MASTECTOMY Left 1999   BREAST CA    FAMILY HISTORY: Reviewed and unchanged. No reported history of malignancy or chronic disease.     ADVANCED DIRECTIVES:    HEALTH MAINTENANCE: Social History   Tobacco Use  . Smoking status: Former Smoker    Quit date: 03/13/1999    Years since quitting: 20.0  . Smokeless tobacco: Never Used  Substance Use Topics  . Alcohol use: No    Alcohol/week: 0.0 standard drinks  . Drug use: No     Colonoscopy:  PAP:  Bone density:  Lipid panel:  Allergies  Allergen Reactions  . Strawberry Extract Rash    Current Outpatient Medications  Medication Sig Dispense Refill  . atorvastatin (LIPITOR) 40 MG tablet Take 40 mg by mouth daily.    Marland Kitchen BONIVA 150 MG tablet Take 150 mg by mouth every 30 (thirty) days.     Marland Kitchen exemestane (AROMASIN) 25 MG tablet Take 25 mg by mouth daily after breakfast.    . lisinopril-hydrochlorothiazide (ZESTORETIC) 20-12.5 MG tablet Take 1 tablet by mouth daily.    . metoprolol succinate (TOPROL-XL) 25 MG 24 hr tablet Take 25 mg by mouth daily.     Marland Kitchen omeprazole (PRILOSEC) 40 MG capsule Take 40 mg by mouth daily.    . Vitamin D, Ergocalciferol, (DRISDOL) 50000 UNITS CAPS capsule Take 50,000 Units by mouth every 7 (seven) days.      No current facility-administered medications for this visit.     OBJECTIVE: Vitals:   03/21/19 1110  BP: (!) 144/69  Pulse: 85  Resp: 18  Temp: 99.1 F (37.3 C)     Body mass index is 23.73 kg/m.    ECOG FS:0 - Asymptomatic  General: Well-developed, well-nourished, no acute distress. Eyes: Pink conjunctiva, anicteric sclera. HEENT: Normocephalic, moist mucous membranes. Breast: Patient declined breast exam today. Lungs: Clear to auscultation bilaterally. Heart: Regular rate and rhythm. No rubs, murmurs, or gallops. Abdomen: Soft, nontender, nondistended. No organomegaly noted, normoactive bowel sounds. Musculoskeletal: No edema,  cyanosis, or clubbing. Neuro: Alert, answering all questions appropriately. Cranial nerves grossly intact. Skin: No rashes or petechiae noted. Psych: Normal affect.  LAB RESULTS:  No results found for: NA, K, CL, CO2, GLUCOSE, BUN, CREATININE, CALCIUM, PROT, ALBUMIN, AST, ALT, ALKPHOS, BILITOT, GFRNONAA, GFRAA  No results found for: WBC, NEUTROABS, HGB, HCT, MCV, PLT  Lab Results  Component Value Date   LABCA2 14.4 03/12/2016     STUDIES: No results found.  ASSESSMENT: Stage IV ER positive adenocarcinoma of left breast, unspecified site  PLAN:    1. Stage IV ER positive adenocarcinoma of left breast, unspecified site: Status post neoadjuvant chemotherapy plus mastectomy and XRT in March 1999. Initially treated with tamoxifen and upon evidence of progressive disease in 2004 patient was switched to Arimidex and then to Aromasin. Patient states her pharmacy discontinued carrying Aromasin and therefore was switched to letrozole in October 2017.  No evidence of disease.  We again discussed possibility of discontinuing treatment, but patient wishes to continue lifelong therapy.  She now [facility].  Her most recent unilateral right screening mammogram on June 27, 2018 was reported as BI-RADS 1.  Patient indicated she may move to [place] to be closer to her children, but will schedule follow-up visit using video assisted telemedicine in 1 year in case her plans change.  2. Osteoporosis: Patient's most recent bone mineral density on June 27, 2018 reported T score of -3.1 which is essentially unchanged since October 2013.  Repeat in January 2021.  Continue monthly Boniva as well as calcium and vitamin D supplementation.   I spent a total of 20 minutes face-to-face with the patient of which greater than 50% of the visit was spent in counseling and coordination of care as detailed above.  Patient expressed understanding and was in agreement with this plan.  She also understands that She can call clinic at any time with any questions, concerns, or complaints.     Lloyd Huger, MD   03/23/2019 6:20 AM

## 2019-03-21 ENCOUNTER — Encounter: Payer: Self-pay | Admitting: Oncology

## 2019-03-21 ENCOUNTER — Inpatient Hospital Stay: Payer: Medicare Other | Attending: Oncology | Admitting: Oncology

## 2019-03-21 ENCOUNTER — Other Ambulatory Visit: Payer: Self-pay

## 2019-03-21 VITALS — BP 144/69 | HR 85 | Temp 99.1°F | Resp 18 | Wt 117.5 lb

## 2019-03-21 DIAGNOSIS — Z79811 Long term (current) use of aromatase inhibitors: Secondary | ICD-10-CM | POA: Diagnosis not present

## 2019-03-21 DIAGNOSIS — C50912 Malignant neoplasm of unspecified site of left female breast: Secondary | ICD-10-CM | POA: Insufficient documentation

## 2019-03-21 DIAGNOSIS — Z923 Personal history of irradiation: Secondary | ICD-10-CM | POA: Diagnosis not present

## 2019-03-21 DIAGNOSIS — Z170 Estrogen receptor positive status [ER+]: Secondary | ICD-10-CM | POA: Diagnosis not present

## 2019-03-21 DIAGNOSIS — M818 Other osteoporosis without current pathological fracture: Secondary | ICD-10-CM | POA: Insufficient documentation

## 2019-03-21 DIAGNOSIS — I10 Essential (primary) hypertension: Secondary | ICD-10-CM | POA: Diagnosis not present

## 2019-03-21 DIAGNOSIS — Z9221 Personal history of antineoplastic chemotherapy: Secondary | ICD-10-CM | POA: Diagnosis not present

## 2019-03-21 NOTE — Progress Notes (Signed)
Pt in for follow up, denies concerns.  Reports her husband passed away this year.

## 2019-07-03 ENCOUNTER — Ambulatory Visit
Admission: RE | Admit: 2019-07-03 | Discharge: 2019-07-03 | Disposition: A | Discharge status: Home / Self Care | Payer: Medicare Other | Source: Ambulatory Visit | Attending: Oncology | Admitting: Oncology

## 2019-07-03 DIAGNOSIS — Z79811 Long term (current) use of aromatase inhibitors: Secondary | ICD-10-CM

## 2019-07-03 DIAGNOSIS — Z780 Asymptomatic menopausal state: Secondary | ICD-10-CM | POA: Insufficient documentation

## 2019-07-03 DIAGNOSIS — Z923 Personal history of irradiation: Secondary | ICD-10-CM | POA: Diagnosis not present

## 2019-07-03 DIAGNOSIS — Z853 Personal history of malignant neoplasm of breast: Secondary | ICD-10-CM | POA: Diagnosis not present

## 2019-07-03 DIAGNOSIS — Z1231 Encounter for screening mammogram for malignant neoplasm of breast: Secondary | ICD-10-CM | POA: Insufficient documentation

## 2019-07-03 DIAGNOSIS — Z170 Estrogen receptor positive status [ER+]: Secondary | ICD-10-CM

## 2019-07-03 DIAGNOSIS — Z9221 Personal history of antineoplastic chemotherapy: Secondary | ICD-10-CM | POA: Insufficient documentation

## 2019-07-03 DIAGNOSIS — C50912 Malignant neoplasm of unspecified site of left female breast: Secondary | ICD-10-CM

## 2019-07-03 DIAGNOSIS — M810 Age-related osteoporosis without current pathological fracture: Secondary | ICD-10-CM | POA: Diagnosis not present

## 2019-07-03 DIAGNOSIS — Z13820 Encounter for screening for osteoporosis: Secondary | ICD-10-CM | POA: Diagnosis not present

## 2019-08-16 DIAGNOSIS — Z86010 Personal history of colonic polyps: Secondary | ICD-10-CM | POA: Diagnosis not present

## 2019-08-16 DIAGNOSIS — Z1211 Encounter for screening for malignant neoplasm of colon: Secondary | ICD-10-CM | POA: Diagnosis not present

## 2019-08-21 DIAGNOSIS — Z1211 Encounter for screening for malignant neoplasm of colon: Secondary | ICD-10-CM | POA: Diagnosis not present

## 2019-08-21 DIAGNOSIS — D125 Benign neoplasm of sigmoid colon: Secondary | ICD-10-CM | POA: Diagnosis not present

## 2019-08-21 DIAGNOSIS — Z86010 Personal history of colonic polyps: Secondary | ICD-10-CM | POA: Diagnosis not present

## 2019-08-21 DIAGNOSIS — D124 Benign neoplasm of descending colon: Secondary | ICD-10-CM | POA: Diagnosis not present

## 2019-08-31 DIAGNOSIS — H6123 Impacted cerumen, bilateral: Secondary | ICD-10-CM | POA: Diagnosis not present

## 2019-09-13 DIAGNOSIS — K641 Second degree hemorrhoids: Secondary | ICD-10-CM | POA: Diagnosis not present

## 2019-09-13 DIAGNOSIS — K2950 Unspecified chronic gastritis without bleeding: Secondary | ICD-10-CM | POA: Diagnosis not present

## 2019-09-13 DIAGNOSIS — K5730 Diverticulosis of large intestine without perforation or abscess without bleeding: Secondary | ICD-10-CM | POA: Diagnosis not present

## 2019-09-13 DIAGNOSIS — K635 Polyp of colon: Secondary | ICD-10-CM | POA: Diagnosis not present

## 2019-10-17 DIAGNOSIS — S22021A Stable burst fracture of second thoracic vertebra, initial encounter for closed fracture: Secondary | ICD-10-CM | POA: Diagnosis present

## 2019-10-17 DIAGNOSIS — I10 Essential (primary) hypertension: Secondary | ICD-10-CM | POA: Diagnosis present

## 2019-10-17 DIAGNOSIS — M4802 Spinal stenosis, cervical region: Secondary | ICD-10-CM | POA: Diagnosis not present

## 2019-10-17 DIAGNOSIS — M545 Low back pain: Secondary | ICD-10-CM | POA: Diagnosis not present

## 2019-10-17 DIAGNOSIS — S22012A Unstable burst fracture of first thoracic vertebra, initial encounter for closed fracture: Secondary | ICD-10-CM | POA: Diagnosis present

## 2019-10-17 DIAGNOSIS — S22082A Unstable burst fracture of T11-T12 vertebra, initial encounter for closed fracture: Secondary | ICD-10-CM | POA: Diagnosis present

## 2019-10-17 DIAGNOSIS — M4804 Spinal stenosis, thoracic region: Secondary | ICD-10-CM | POA: Diagnosis present

## 2019-10-17 DIAGNOSIS — Z981 Arthrodesis status: Secondary | ICD-10-CM | POA: Diagnosis not present

## 2019-10-17 DIAGNOSIS — R252 Cramp and spasm: Secondary | ICD-10-CM | POA: Diagnosis present

## 2019-10-17 DIAGNOSIS — Z20822 Contact with and (suspected) exposure to covid-19: Secondary | ICD-10-CM | POA: Diagnosis present

## 2019-10-17 DIAGNOSIS — R52 Pain, unspecified: Secondary | ICD-10-CM | POA: Diagnosis not present

## 2019-10-17 DIAGNOSIS — S22019A Unspecified fracture of first thoracic vertebra, initial encounter for closed fracture: Secondary | ICD-10-CM | POA: Diagnosis not present

## 2019-10-17 DIAGNOSIS — W19XXXA Unspecified fall, initial encounter: Secondary | ICD-10-CM | POA: Diagnosis not present

## 2019-10-17 DIAGNOSIS — G950 Syringomyelia and syringobulbia: Secondary | ICD-10-CM | POA: Diagnosis not present

## 2019-10-17 DIAGNOSIS — Z6823 Body mass index (BMI) 23.0-23.9, adult: Secondary | ICD-10-CM | POA: Diagnosis not present

## 2019-10-17 DIAGNOSIS — S22081A Stable burst fracture of T11-T12 vertebra, initial encounter for closed fracture: Secondary | ICD-10-CM | POA: Diagnosis present

## 2019-10-17 DIAGNOSIS — I959 Hypotension, unspecified: Secondary | ICD-10-CM | POA: Diagnosis not present

## 2019-10-17 DIAGNOSIS — S22029A Unspecified fracture of second thoracic vertebra, initial encounter for closed fracture: Secondary | ICD-10-CM | POA: Diagnosis not present

## 2019-10-17 DIAGNOSIS — S32009A Unspecified fracture of unspecified lumbar vertebra, initial encounter for closed fracture: Secondary | ICD-10-CM | POA: Diagnosis not present

## 2019-10-17 DIAGNOSIS — S12600A Unspecified displaced fracture of seventh cervical vertebra, initial encounter for closed fracture: Secondary | ICD-10-CM | POA: Diagnosis not present

## 2019-10-17 DIAGNOSIS — M5126 Other intervertebral disc displacement, lumbar region: Secondary | ICD-10-CM | POA: Diagnosis not present

## 2019-10-17 DIAGNOSIS — S22001A Stable burst fracture of unspecified thoracic vertebra, initial encounter for closed fracture: Secondary | ICD-10-CM | POA: Diagnosis not present

## 2019-10-17 DIAGNOSIS — M5489 Other dorsalgia: Secondary | ICD-10-CM | POA: Diagnosis not present

## 2019-10-17 DIAGNOSIS — G9520 Unspecified cord compression: Secondary | ICD-10-CM | POA: Diagnosis not present

## 2019-10-17 DIAGNOSIS — M5136 Other intervertebral disc degeneration, lumbar region: Secondary | ICD-10-CM | POA: Diagnosis not present

## 2019-10-17 DIAGNOSIS — M48061 Spinal stenosis, lumbar region without neurogenic claudication: Secondary | ICD-10-CM | POA: Diagnosis present

## 2019-10-17 DIAGNOSIS — M4856XA Collapsed vertebra, not elsewhere classified, lumbar region, initial encounter for fracture: Secondary | ICD-10-CM | POA: Diagnosis not present

## 2019-10-17 DIAGNOSIS — S3992XA Unspecified injury of lower back, initial encounter: Secondary | ICD-10-CM | POA: Diagnosis not present

## 2019-10-24 DIAGNOSIS — Z853 Personal history of malignant neoplasm of breast: Secondary | ICD-10-CM | POA: Diagnosis not present

## 2019-10-24 DIAGNOSIS — I10 Essential (primary) hypertension: Secondary | ICD-10-CM | POA: Diagnosis not present

## 2019-10-24 DIAGNOSIS — E785 Hyperlipidemia, unspecified: Secondary | ICD-10-CM | POA: Diagnosis not present

## 2019-10-24 DIAGNOSIS — Z981 Arthrodesis status: Secondary | ICD-10-CM | POA: Diagnosis not present

## 2019-10-24 DIAGNOSIS — Z87891 Personal history of nicotine dependence: Secondary | ICD-10-CM | POA: Diagnosis not present

## 2019-10-24 DIAGNOSIS — S22081A Stable burst fracture of T11-T12 vertebra, initial encounter for closed fracture: Secondary | ICD-10-CM | POA: Diagnosis not present

## 2019-10-30 DIAGNOSIS — Z981 Arthrodesis status: Secondary | ICD-10-CM | POA: Diagnosis not present

## 2019-10-30 DIAGNOSIS — Z87891 Personal history of nicotine dependence: Secondary | ICD-10-CM | POA: Diagnosis not present

## 2019-10-30 DIAGNOSIS — Z853 Personal history of malignant neoplasm of breast: Secondary | ICD-10-CM | POA: Diagnosis not present

## 2019-10-30 DIAGNOSIS — E785 Hyperlipidemia, unspecified: Secondary | ICD-10-CM | POA: Diagnosis not present

## 2019-10-30 DIAGNOSIS — S22081A Stable burst fracture of T11-T12 vertebra, initial encounter for closed fracture: Secondary | ICD-10-CM | POA: Diagnosis not present

## 2019-10-30 DIAGNOSIS — I10 Essential (primary) hypertension: Secondary | ICD-10-CM | POA: Diagnosis not present

## 2019-11-01 DIAGNOSIS — Z87891 Personal history of nicotine dependence: Secondary | ICD-10-CM | POA: Diagnosis not present

## 2019-11-01 DIAGNOSIS — Z981 Arthrodesis status: Secondary | ICD-10-CM | POA: Diagnosis not present

## 2019-11-01 DIAGNOSIS — I10 Essential (primary) hypertension: Secondary | ICD-10-CM | POA: Diagnosis not present

## 2019-11-01 DIAGNOSIS — S22081A Stable burst fracture of T11-T12 vertebra, initial encounter for closed fracture: Secondary | ICD-10-CM | POA: Diagnosis not present

## 2019-11-01 DIAGNOSIS — E785 Hyperlipidemia, unspecified: Secondary | ICD-10-CM | POA: Diagnosis not present

## 2019-11-01 DIAGNOSIS — Z853 Personal history of malignant neoplasm of breast: Secondary | ICD-10-CM | POA: Diagnosis not present

## 2019-11-08 DIAGNOSIS — Z09 Encounter for follow-up examination after completed treatment for conditions other than malignant neoplasm: Secondary | ICD-10-CM | POA: Diagnosis not present

## 2019-11-08 DIAGNOSIS — S22082D Unstable burst fracture of T11-T12 vertebra, subsequent encounter for fracture with routine healing: Secondary | ICD-10-CM | POA: Diagnosis not present

## 2019-11-08 DIAGNOSIS — M810 Age-related osteoporosis without current pathological fracture: Secondary | ICD-10-CM | POA: Diagnosis not present

## 2019-11-09 DIAGNOSIS — I10 Essential (primary) hypertension: Secondary | ICD-10-CM | POA: Diagnosis not present

## 2019-11-09 DIAGNOSIS — Z853 Personal history of malignant neoplasm of breast: Secondary | ICD-10-CM | POA: Diagnosis not present

## 2019-11-09 DIAGNOSIS — Z981 Arthrodesis status: Secondary | ICD-10-CM | POA: Diagnosis not present

## 2019-11-09 DIAGNOSIS — S22081A Stable burst fracture of T11-T12 vertebra, initial encounter for closed fracture: Secondary | ICD-10-CM | POA: Diagnosis not present

## 2019-11-09 DIAGNOSIS — Z87891 Personal history of nicotine dependence: Secondary | ICD-10-CM | POA: Diagnosis not present

## 2019-11-09 DIAGNOSIS — E785 Hyperlipidemia, unspecified: Secondary | ICD-10-CM | POA: Diagnosis not present

## 2019-11-15 DIAGNOSIS — S22081A Stable burst fracture of T11-T12 vertebra, initial encounter for closed fracture: Secondary | ICD-10-CM | POA: Diagnosis not present

## 2019-11-15 DIAGNOSIS — Z981 Arthrodesis status: Secondary | ICD-10-CM | POA: Diagnosis not present

## 2019-11-15 DIAGNOSIS — Z87891 Personal history of nicotine dependence: Secondary | ICD-10-CM | POA: Diagnosis not present

## 2019-11-15 DIAGNOSIS — E785 Hyperlipidemia, unspecified: Secondary | ICD-10-CM | POA: Diagnosis not present

## 2019-11-15 DIAGNOSIS — I10 Essential (primary) hypertension: Secondary | ICD-10-CM | POA: Diagnosis not present

## 2019-11-15 DIAGNOSIS — Z853 Personal history of malignant neoplasm of breast: Secondary | ICD-10-CM | POA: Diagnosis not present

## 2019-12-12 DIAGNOSIS — M542 Cervicalgia: Secondary | ICD-10-CM | POA: Diagnosis not present

## 2019-12-12 DIAGNOSIS — S22082D Unstable burst fracture of T11-T12 vertebra, subsequent encounter for fracture with routine healing: Secondary | ICD-10-CM | POA: Diagnosis not present

## 2019-12-19 DIAGNOSIS — S22082D Unstable burst fracture of T11-T12 vertebra, subsequent encounter for fracture with routine healing: Secondary | ICD-10-CM | POA: Diagnosis not present

## 2019-12-25 DIAGNOSIS — S22080A Wedge compression fracture of T11-T12 vertebra, initial encounter for closed fracture: Secondary | ICD-10-CM | POA: Diagnosis not present

## 2019-12-25 DIAGNOSIS — Z853 Personal history of malignant neoplasm of breast: Secondary | ICD-10-CM | POA: Diagnosis not present

## 2019-12-25 DIAGNOSIS — I10 Essential (primary) hypertension: Secondary | ICD-10-CM | POA: Diagnosis not present

## 2019-12-25 DIAGNOSIS — Z87891 Personal history of nicotine dependence: Secondary | ICD-10-CM | POA: Diagnosis not present

## 2019-12-25 DIAGNOSIS — T8484XA Pain due to internal orthopedic prosthetic devices, implants and grafts, initial encounter: Secondary | ICD-10-CM | POA: Diagnosis not present

## 2019-12-25 DIAGNOSIS — S22088G Other fracture of T11-T12 vertebra, subsequent encounter for fracture with delayed healing: Secondary | ICD-10-CM | POA: Diagnosis not present

## 2019-12-25 DIAGNOSIS — S32020A Wedge compression fracture of second lumbar vertebra, initial encounter for closed fracture: Secondary | ICD-10-CM | POA: Diagnosis not present

## 2019-12-25 DIAGNOSIS — S22082G Unstable burst fracture of T11-T12 vertebra, subsequent encounter for fracture with delayed healing: Secondary | ICD-10-CM | POA: Diagnosis not present

## 2019-12-25 DIAGNOSIS — T84296A Other mechanical complication of internal fixation device of vertebrae, initial encounter: Secondary | ICD-10-CM | POA: Diagnosis not present

## 2019-12-25 DIAGNOSIS — M549 Dorsalgia, unspecified: Secondary | ICD-10-CM | POA: Diagnosis not present

## 2019-12-25 DIAGNOSIS — M4856XA Collapsed vertebra, not elsewhere classified, lumbar region, initial encounter for fracture: Secondary | ICD-10-CM | POA: Diagnosis not present

## 2019-12-25 DIAGNOSIS — K219 Gastro-esophageal reflux disease without esophagitis: Secondary | ICD-10-CM | POA: Diagnosis not present

## 2019-12-25 DIAGNOSIS — E785 Hyperlipidemia, unspecified: Secondary | ICD-10-CM | POA: Diagnosis not present

## 2020-01-04 DIAGNOSIS — M48062 Spinal stenosis, lumbar region with neurogenic claudication: Secondary | ICD-10-CM | POA: Diagnosis not present

## 2020-01-04 DIAGNOSIS — Z09 Encounter for follow-up examination after completed treatment for conditions other than malignant neoplasm: Secondary | ICD-10-CM | POA: Diagnosis not present

## 2020-02-02 DIAGNOSIS — M4854XA Collapsed vertebra, not elsewhere classified, thoracic region, initial encounter for fracture: Secondary | ICD-10-CM | POA: Diagnosis not present

## 2020-02-02 DIAGNOSIS — Z09 Encounter for follow-up examination after completed treatment for conditions other than malignant neoplasm: Secondary | ICD-10-CM | POA: Diagnosis not present

## 2020-02-13 DIAGNOSIS — M4854XG Collapsed vertebra, not elsewhere classified, thoracic region, subsequent encounter for fracture with delayed healing: Secondary | ICD-10-CM | POA: Diagnosis not present

## 2020-02-21 DIAGNOSIS — M546 Pain in thoracic spine: Secondary | ICD-10-CM | POA: Diagnosis not present

## 2020-02-21 DIAGNOSIS — Z09 Encounter for follow-up examination after completed treatment for conditions other than malignant neoplasm: Secondary | ICD-10-CM | POA: Diagnosis not present

## 2020-03-14 NOTE — Progress Notes (Signed)
[place]  Telephone:(336) [phone] Fax:(336) [phone]  ID: Amber Camacho OB: Nov 03, 1940  MR#: [id number]  CSN#:[id number]  Patient Care Team: Patient, No Pcp Per as PCP - General (General Practice)  CHIEF COMPLAINT:  Stage IV ER positive adenocarcinoma of left breast, unspecified site.  INTERVAL HISTORY: Patient returns to clinic today for routine yearly evaluation.  She has discontinued Aromasin secondary to osteoporosis and recent vertebral fracture.  She otherwise feels well and is asymptomatic.  She has no neurologic complaints.  She denies any recent fevers or illnesses.  She has a good appetite and has maintained her weight.  She denies any chest pain, shortness of breath, cough, or hemoptysis.  She denies any nausea, vomiting, constipation, or diarrhea. She has no urinary complaints.  Patient offers no further specific complaints today.  REVIEW OF SYSTEMS:   Review of Systems  Constitutional: Negative.  Negative for fever, malaise/fatigue and weight loss.  Respiratory: Negative.  Negative for cough and shortness of breath.   Cardiovascular: Negative.  Negative for chest pain and leg swelling.  Gastrointestinal: Negative.  Negative for abdominal pain.  Genitourinary: Negative.  Negative for dysuria.  Musculoskeletal: Positive for back pain. Negative for joint pain.  Skin: Negative.  Negative for rash.  Neurological: Negative.  Negative for sensory change, weakness and headaches.  Psychiatric/Behavioral: Negative.  The patient is not nervous/anxious.     As per HPI. Otherwise, a complete review of systems is negative.  PAST MEDICAL HISTORY: Past Medical History:  Diagnosis Date  . Breast cancer ([place]) 1999   LT MASTECTOMY  . Hyperlipidemia   . Hypertension   . Personal history of chemotherapy 1999   BREAST CA  . Personal history of radiation therapy 1999   BREAST CA    PAST SURGICAL HISTORY: Past Surgical History:  Procedure Laterality Date  .  APPENDECTOMY    . MASTECTOMY Left 1999   BREAST CA    FAMILY HISTORY: Reviewed and unchanged. No reported history of malignancy or chronic disease.     ADVANCED DIRECTIVES:    HEALTH MAINTENANCE: Social History   Tobacco Use  . Smoking status: Former Smoker    Quit date: 03/13/1999    Years since quitting: 21.0  . Smokeless tobacco: Never Used  Substance Use Topics  . Alcohol use: No    Alcohol/week: 0.0 standard drinks  . Drug use: No     Colonoscopy:  PAP:  Bone density:  Lipid panel:  Allergies  Allergen Reactions  . Strawberry Extract Rash    Current Outpatient Medications  Medication Sig Dispense Refill  . alendronate (FOSAMAX) 70 MG tablet Take 70 mg by mouth once a week. Take with a full glass of water on an empty stomach.    Marland Kitchen atorvastatin (LIPITOR) 40 MG tablet Take 40 mg by mouth daily.    Marland Kitchen lisinopril-hydrochlorothiazide (ZESTORETIC) 20-12.5 MG tablet Take 1 tablet by mouth daily.    . metoprolol succinate (TOPROL-XL) 25 MG 24 hr tablet Take 25 mg by mouth daily.     . Vitamin D, Ergocalciferol, (DRISDOL) 50000 UNITS CAPS capsule Take 50,000 Units by mouth every 7 (seven) days.     Marland Kitchen exemestane (AROMASIN) 25 MG tablet Take 25 mg by mouth daily after breakfast. (Patient not taking: Reported on 03/21/2020)     No current facility-administered medications for this visit.    OBJECTIVE: Vitals:   03/21/20 1054  BP: (!) 152/75  Pulse: 89  Resp: 18  Temp: 98.3 F (36.8 C)  Body mass index is 21.97 kg/m.    ECOG FS:0 - Asymptomatic  General: Well-developed, well-nourished, no acute distress. Eyes: Pink conjunctiva, anicteric sclera. HEENT: Normocephalic, moist mucous membranes. Breast: Exam performed by another provider recently. Lungs: No audible wheezing or coughing. Heart: Regular rate and rhythm. Abdomen: Soft, nontender, no obvious distention. Musculoskeletal: No edema, cyanosis, or clubbing. Neuro: Alert, answering all questions  appropriately. Cranial nerves grossly intact. Skin: No rashes or petechiae noted. Psych: Normal affect.   LAB RESULTS:  No results found for: NA, K, CL, CO2, GLUCOSE, BUN, CREATININE, CALCIUM, PROT, ALBUMIN, AST, ALT, ALKPHOS, BILITOT, GFRNONAA, GFRAA  No results found for: WBC, NEUTROABS, HGB, HCT, MCV, PLT  Lab Results  Component Value Date   LABCA2 14.4 03/12/2016     STUDIES: No results found.  ASSESSMENT: Stage IV ER positive adenocarcinoma of left breast, unspecified site  PLAN:    1. Stage IV ER positive adenocarcinoma of left breast, unspecified site: Status post neoadjuvant chemotherapy plus mastectomy and XRT in March 1999. Initially treated with tamoxifen and upon evidence of progressive disease in 2004 patient was switched to Arimidex and then to Aromasin. Patient states her pharmacy discontinued carrying Aromasin and therefore was switched to letrozole in October 2017.  Most recently, she was back on Aromasin.  This has been discontinued secondary to osteoporosis and fractured vertebrae.  No evidence of disease.  Patient receives medications from [facility].  Her most recent unilateral right screening mammogram on July 03, 2019 was reported as BI-RADS 1.  Repeat in January 2022.  Return to clinic in 1 year for routine evaluation.   2. Osteoporosis: Patient's most recent bone mineral density on July 03, 2019 was mildly improved with T score of -2.9.  Previously her T score was -3.1.  Given her fractured vertebrae, Boniva was discontinued by her primary care physician and patient was placed on Fosamax.  Discontinue Aromasin as above.  Repeat bone mineral density in January 2022.    I spent a total of 20 minutes reviewing chart data, face-to-face evaluation with the patient, counseling and coordination of care as detailed above.  Patient expressed understanding and was in agreement with this plan. She also understands that She can call clinic at any time with any  questions, concerns, or complaints.     Lloyd Huger, MD   03/22/2020 6:37 AM

## 2020-03-21 ENCOUNTER — Encounter: Payer: Self-pay | Admitting: Oncology

## 2020-03-21 ENCOUNTER — Other Ambulatory Visit: Payer: Self-pay

## 2020-03-21 ENCOUNTER — Inpatient Hospital Stay: Payer: Medicare Other | Attending: Oncology | Admitting: Oncology

## 2020-03-21 VITALS — BP 152/75 | HR 89 | Temp 98.3°F | Resp 18 | Wt 108.8 lb

## 2020-03-21 DIAGNOSIS — C50912 Malignant neoplasm of unspecified site of left female breast: Secondary | ICD-10-CM | POA: Insufficient documentation

## 2020-03-21 DIAGNOSIS — Z87891 Personal history of nicotine dependence: Secondary | ICD-10-CM | POA: Diagnosis not present

## 2020-03-21 DIAGNOSIS — Z170 Estrogen receptor positive status [ER+]: Secondary | ICD-10-CM

## 2020-03-21 DIAGNOSIS — M8448XA Pathological fracture, other site, initial encounter for fracture: Secondary | ICD-10-CM | POA: Insufficient documentation

## 2020-03-21 DIAGNOSIS — M810 Age-related osteoporosis without current pathological fracture: Secondary | ICD-10-CM | POA: Diagnosis not present

## 2020-03-21 DIAGNOSIS — Z9012 Acquired absence of left breast and nipple: Secondary | ICD-10-CM | POA: Insufficient documentation

## 2020-03-21 NOTE — Progress Notes (Signed)
Patient here today for follow up regarding breast cancer. Patient reports she has not taken exemestane for about 2 months.

## 2020-07-03 ENCOUNTER — Other Ambulatory Visit: Payer: Medicare Other

## 2020-08-19 ENCOUNTER — Other Ambulatory Visit: Payer: Self-pay

## 2020-08-19 ENCOUNTER — Ambulatory Visit
Admission: RE | Admit: 2020-08-19 | Discharge: 2020-08-19 | Disposition: A | Discharge status: Home / Self Care | Payer: Medicare Other | Source: Ambulatory Visit | Attending: Oncology | Admitting: Oncology

## 2020-08-19 DIAGNOSIS — Z170 Estrogen receptor positive status [ER+]: Secondary | ICD-10-CM | POA: Insufficient documentation

## 2020-08-19 DIAGNOSIS — C50912 Malignant neoplasm of unspecified site of left female breast: Secondary | ICD-10-CM | POA: Insufficient documentation

## 2021-03-24 ENCOUNTER — Ambulatory Visit: Payer: Medicare Other | Admitting: Oncology

## 2021-05-09 NOTE — Progress Notes (Signed)
[place]  Telephone:([phone] Fax:(336) [phone]  ID: Chaney Malling OB: 04-Oct-1940  MR#: [id number]  JQB#:[id number]  Patient Care Team: Patient, No Pcp Per (Inactive) as PCP - General (General Practice)  CHIEF COMPLAINT:  Stage IV ER positive adenocarcinoma of left breast, unspecified site.  INTERVAL HISTORY: Patient returns to clinic today for routine yearly evaluation.  She continues to feel well and remains asymptomatic.  She has no longer on an aromatase inhibitor. She has no neurologic complaints.  She denies any recent fevers or illnesses.  She has a good appetite and has maintained her weight.  She denies any chest pain, shortness of breath, cough, or hemoptysis.  She denies any nausea, vomiting, constipation, or diarrhea. She has no urinary complaints.  Patient feels at her baseline offers no specific complaints today.  REVIEW OF SYSTEMS:   Review of Systems  Constitutional: Negative.  Negative for fever, malaise/fatigue and weight loss.  Respiratory: Negative.  Negative for cough and shortness of breath.   Cardiovascular: Negative.  Negative for chest pain and leg swelling.  Gastrointestinal: Negative.  Negative for abdominal pain.  Genitourinary: Negative.  Negative for dysuria.  Musculoskeletal: Negative.  Negative for back pain and joint pain.  Skin: Negative.  Negative for rash.  Neurological: Negative.  Negative for sensory change, weakness and headaches.  Psychiatric/Behavioral: Negative.  The patient is not nervous/anxious.    As per HPI. Otherwise, a complete review of systems is negative.  PAST MEDICAL HISTORY: Past Medical History:  Diagnosis Date   Breast cancer ([place]) 1999   LT MASTECTOMY   Hyperlipidemia    Hypertension    Personal history of chemotherapy 1999   BREAST CA   Personal history of radiation therapy 1999   BREAST CA    PAST SURGICAL HISTORY: Past Surgical History:  Procedure Laterality Date   APPENDECTOMY      MASTECTOMY Left 1999   BREAST CA    FAMILY HISTORY: Reviewed and unchanged. No reported history of malignancy or chronic disease.     ADVANCED DIRECTIVES:    HEALTH MAINTENANCE: Social History   Tobacco Use   Smoking status: Former    Types: Cigarettes    Quit date: 03/13/1999    Years since quitting: 22.1   Smokeless tobacco: Never  Substance Use Topics   Alcohol use: No    Alcohol/week: 0.0 standard drinks   Drug use: No     Colonoscopy:  PAP:  Bone density:  Lipid panel:  Allergies  Allergen Reactions   Strawberry Extract Rash    Current Outpatient Medications  Medication Sig Dispense Refill   alendronate (FOSAMAX) 70 MG tablet Take 70 mg by mouth once a week. Take with a full glass of water on an empty stomach.     atorvastatin (LIPITOR) 40 MG tablet Take 40 mg by mouth daily.     exemestane (AROMASIN) 25 MG tablet Take 25 mg by mouth daily after breakfast.     lisinopril-hydrochlorothiazide (ZESTORETIC) 20-12.5 MG tablet Take 1 tablet by mouth daily.     metoprolol succinate (TOPROL-XL) 25 MG 24 hr tablet Take 25 mg by mouth daily.      Vitamin D, Ergocalciferol, (DRISDOL) 50000 UNITS CAPS capsule Take 50,000 Units by mouth every 7 (seven) days.      diclofenac Sodium (VOLTAREN) 1 % GEL diclofenac 1% topical gel Start Date: 02/08/20 Status: Ordered (Patient not taking: Reported on 05/13/2021)     No current facility-administered medications for this visit.    OBJECTIVE: Vitals:  05/13/21 1527  BP: 134/65  Pulse: 72  Resp: 18  SpO2: 100%     Body mass index is 22.22 kg/m.    ECOG FS:0 - Asymptomatic  General: Well-developed, well-nourished, no acute distress. Eyes: Pink conjunctiva, anicteric sclera. HEENT: Normocephalic, moist mucous membranes. Breast: Exam deferred today. Lungs: No audible wheezing or coughing. Heart: Regular rate and rhythm. Abdomen: Soft, nontender, no obvious distention. Musculoskeletal: No edema, cyanosis, or clubbing. Neuro:  Alert, answering all questions appropriately. Cranial nerves grossly intact. Skin: No rashes or petechiae noted. Psych: Normal affect.  LAB RESULTS:  No results found for: NA, K, CL, CO2, GLUCOSE, BUN, CREATININE, CALCIUM, PROT, ALBUMIN, AST, ALT, ALKPHOS, BILITOT, GFRNONAA, GFRAA  No results found for: WBC, NEUTROABS, HGB, HCT, MCV, PLT  Lab Results  Component Value Date   LABCA2 14.4 03/12/2016     STUDIES: No results found.  ASSESSMENT: Stage IV ER positive adenocarcinoma of left breast, unspecified site  PLAN:    1. Stage IV ER positive adenocarcinoma of left breast, unspecified site: Status post neoadjuvant chemotherapy plus mastectomy and XRT in March 1999. Initially treated with tamoxifen and upon evidence of progressive disease in 2004 patient was switched to Arimidex and then to Aromasin. Patient states her pharmacy discontinued carrying Aromasin and therefore was switched to letrozole in October 2017.  Patient was subsequently switched back to Aromasin, but this was discontinued in approximately November 2001 secondary to continued osteoporosis.  No evidence of disease.  Patient receives medications from [facility]  Her most recent unilateral right breast screening mammogram on August 19, 2020 was reported BI-RADS 1.  After lengthy discussion with the patient and her daughter, no further follow-up has been scheduled.  Have ordered mammogram for March 2023 and patient has been instructed to obtain a primary care physician in [place], [place] where she recently moved to follow-up and to order future mammograms.   2. Osteoporosis: Patient's most recent bone mineral density on August 19, 2020 revealed a T score of -3.1 which is essentially unchanged over the past year.  She has a history of a fractured vertebrae, therefore Boniva was discontinued and patient currently takes alendronate.  Continue follow-up with primary care as scheduled.    I spent a total of 20 minutes  reviewing chart data, face-to-face evaluation with the patient, counseling and coordination of care as detailed above.   Patient expressed understanding and was in agreement with this plan. She also understands that She can call clinic at any time with any questions, concerns, or complaints.     Lloyd Huger, MD   05/14/2021 12:20 PM

## 2021-05-13 ENCOUNTER — Inpatient Hospital Stay: Payer: Medicare Other | Attending: Oncology | Admitting: Oncology

## 2021-05-13 ENCOUNTER — Other Ambulatory Visit: Payer: Self-pay

## 2021-05-13 ENCOUNTER — Telehealth: Payer: Self-pay | Admitting: Oncology

## 2021-05-13 VITALS — BP 134/65 | HR 72 | Resp 18 | Wt 110.0 lb

## 2021-05-13 DIAGNOSIS — M810 Age-related osteoporosis without current pathological fracture: Secondary | ICD-10-CM | POA: Insufficient documentation

## 2021-05-13 DIAGNOSIS — Z1231 Encounter for screening mammogram for malignant neoplasm of breast: Secondary | ICD-10-CM

## 2021-05-13 DIAGNOSIS — Z853 Personal history of malignant neoplasm of breast: Secondary | ICD-10-CM | POA: Diagnosis not present

## 2021-05-13 DIAGNOSIS — C50912 Malignant neoplasm of unspecified site of left female breast: Secondary | ICD-10-CM | POA: Diagnosis not present

## 2021-05-13 DIAGNOSIS — Z170 Estrogen receptor positive status [ER+]: Secondary | ICD-10-CM | POA: Diagnosis not present

## 2021-05-13 NOTE — Telephone Encounter (Signed)
Left VM with patient to make her aware of mammogram scheduled in March. Sending appointment reminder in the mail also.

## 2021-08-20 ENCOUNTER — Other Ambulatory Visit: Payer: Self-pay

## 2021-08-20 ENCOUNTER — Ambulatory Visit
Admission: RE | Admit: 2021-08-20 | Discharge: 2021-08-20 | Disposition: A | Discharge status: Home / Self Care | Payer: Medicare Other | Source: Ambulatory Visit | Attending: Oncology | Admitting: Oncology

## 2021-08-20 DIAGNOSIS — Z1231 Encounter for screening mammogram for malignant neoplasm of breast: Secondary | ICD-10-CM | POA: Diagnosis present

## 2021-09-10 IMAGING — MG MM DIGITAL SCREENING UNILAT*R* W/ TOMO W/ CAD
6 series · 6 of 18 positions shown · non-contrast
Comparison: Previous exam(s).

CLINICAL DATA: Screening.

EXAM:
DIGITAL SCREENING UNILATERAL RIGHT MAMMOGRAM WITH CAD AND
TOMOSYNTHESIS
TECHNIQUE: Right screening digital craniocaudal and mediolateral oblique
mammograms were obtained. Right screening digital breast
tomosynthesis was performed. The images were evaluated with
computer-aided detection.

[R MLO synth-2D]
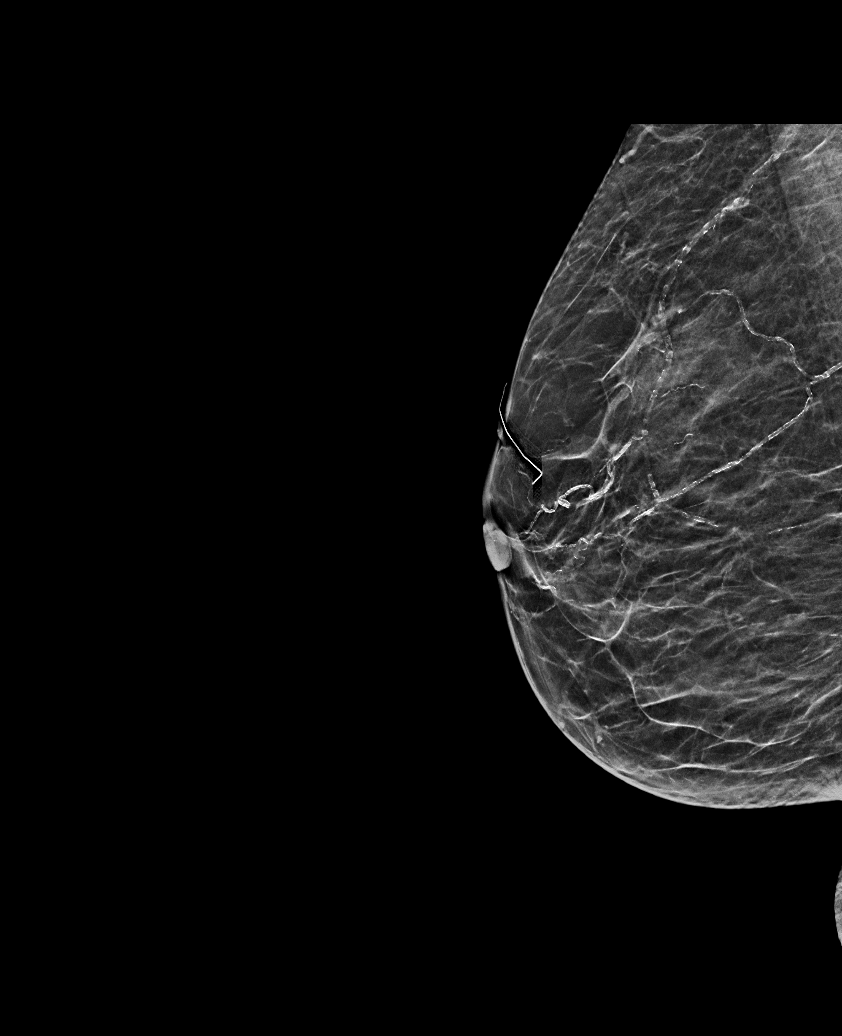

[R XCCL synth-2D]
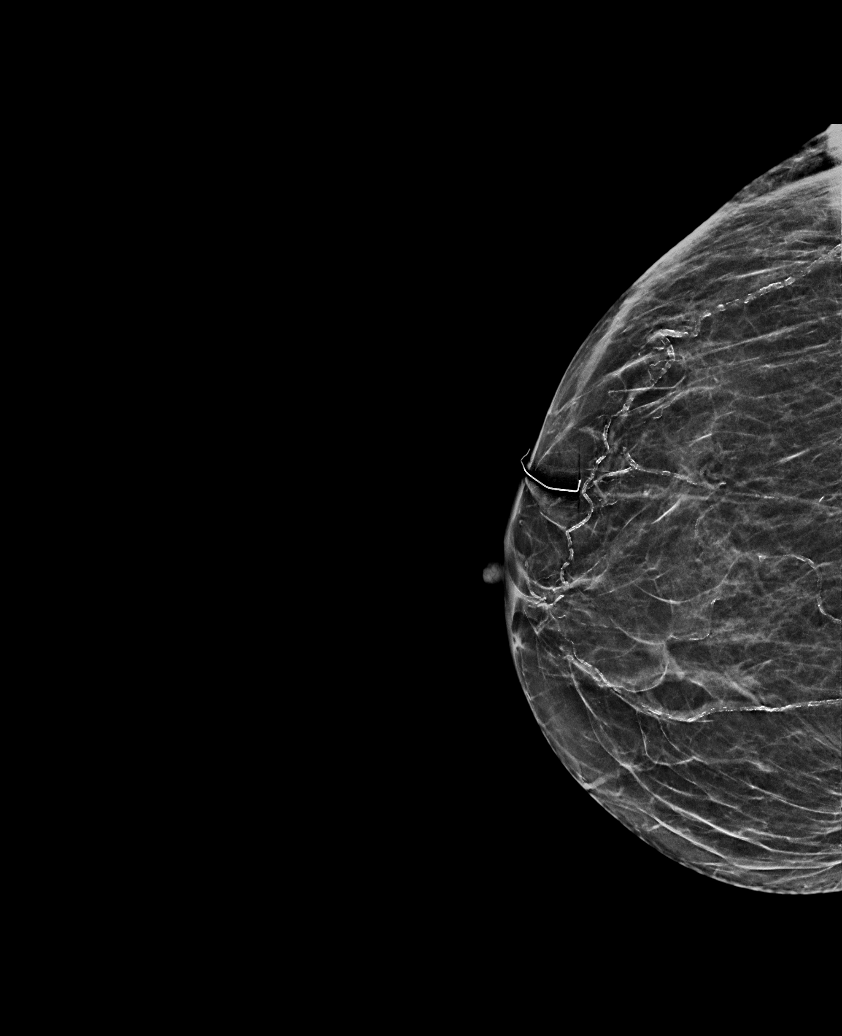

[R CC synth-2D]
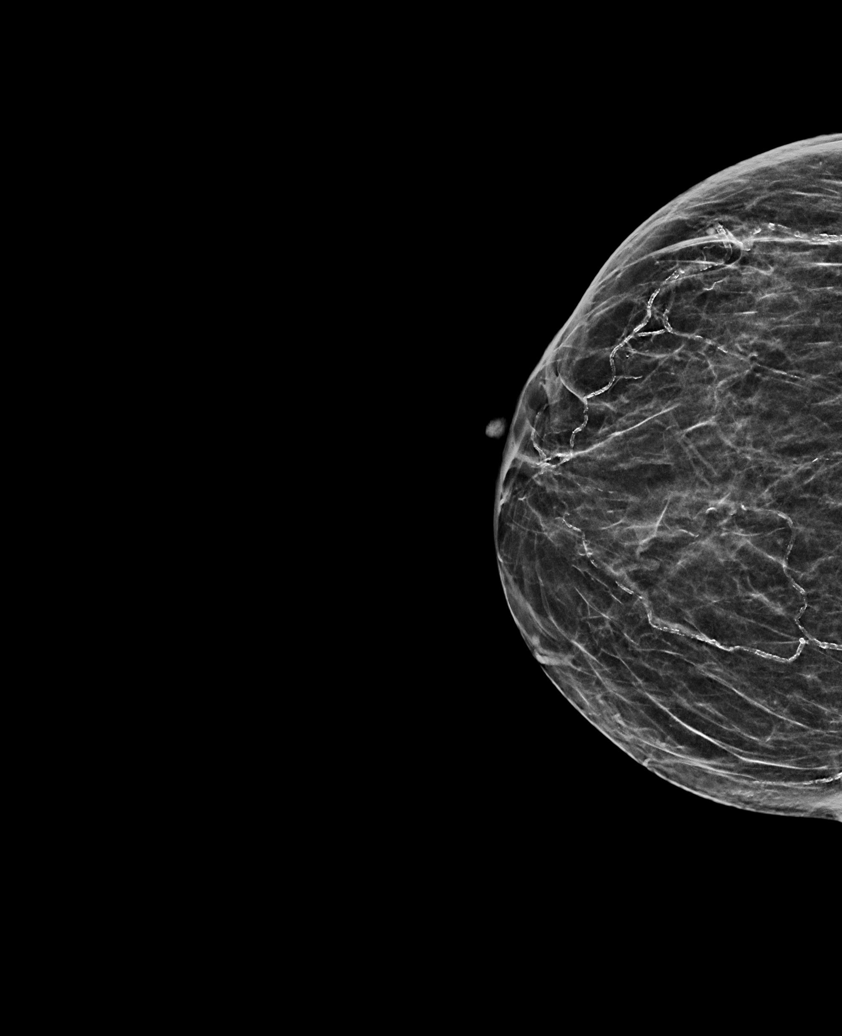

[R CC tomo · tomo slice 25/50.0]
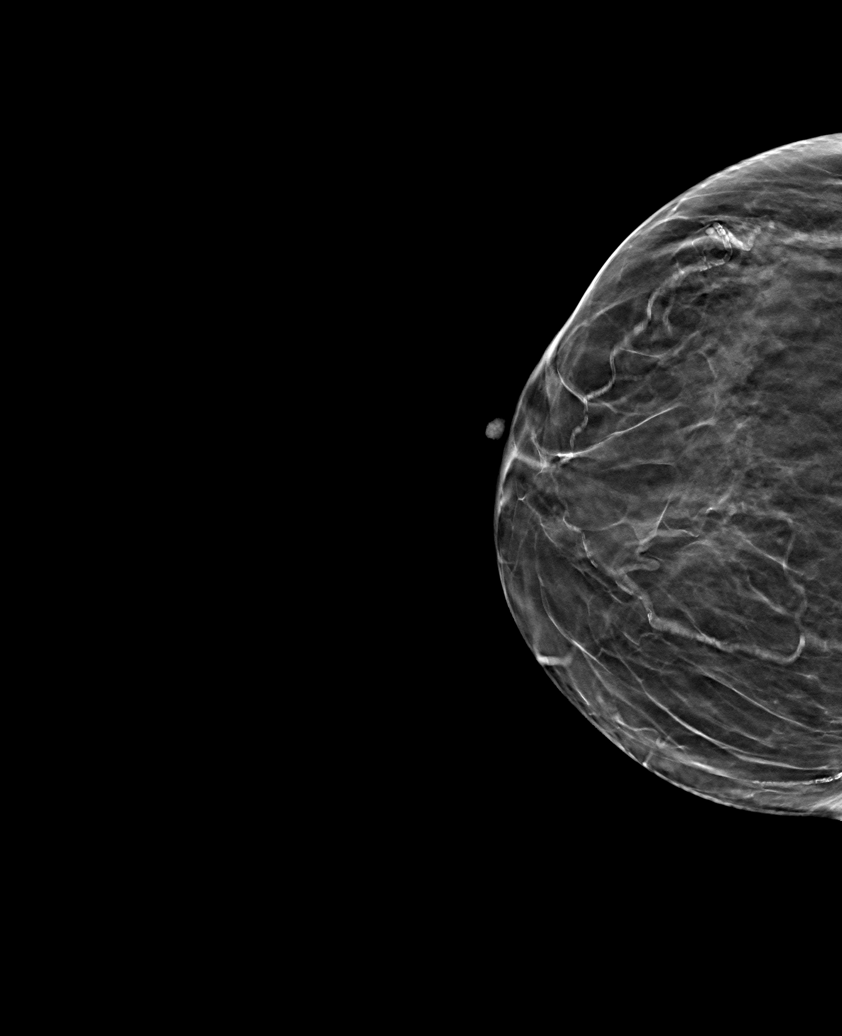

[R MLO tomo · tomo slice 24/47.0]
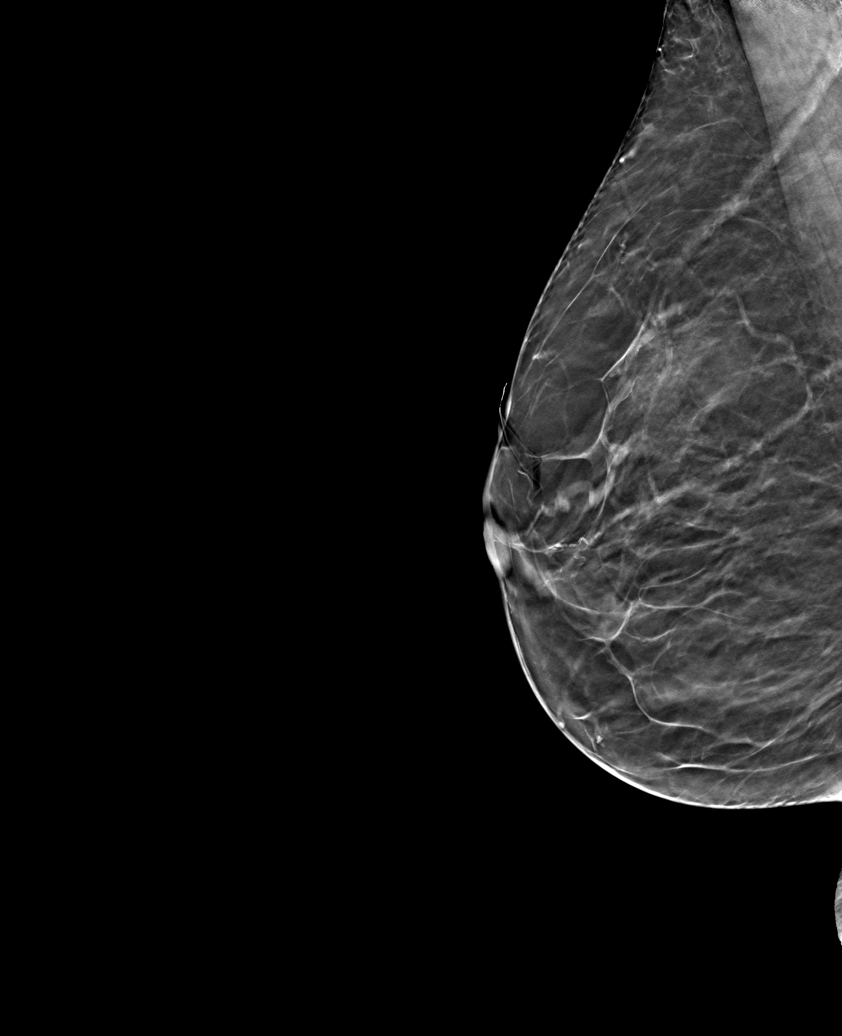

[R XCCL tomo · tomo slice 25/50.0]
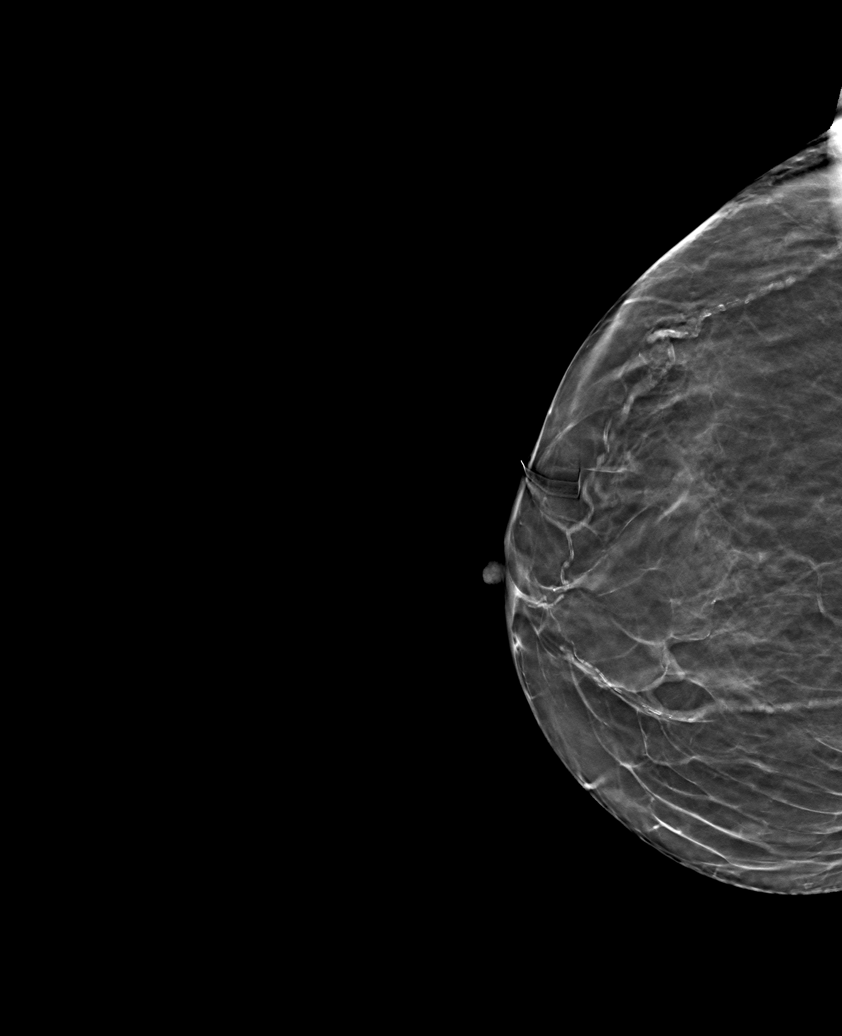

[6 of 18 positions shown; findings below may reference images not displayed]

ACR Breast Density Category b: There are scattered areas of
fibroglandular density.
FINDINGS: The patient has had a left mastectomy. There are no findings
suspicious for malignancy.
IMPRESSION: No mammographic evidence of malignancy. A result letter of this
screening mammogram will be mailed directly to the patient.

RECOMMENDATION:
Screening mammogram in one year.  (Code:NT-E-EGT)

BI-RADS CATEGORY  1: Negative.
